# Patient Record
Sex: Female | Born: 1990 | Hispanic: No | Marital: Married | Smoking: Never smoker
Health system: Southern US, Community
[De-identification: ages and names within clinical notes are randomized; demographics above are authoritative.]

## PROBLEM LIST (undated history)

## (undated) DIAGNOSIS — E669 Obesity, unspecified: Secondary | ICD-10-CM

## (undated) DIAGNOSIS — I10 Essential (primary) hypertension: Secondary | ICD-10-CM

## (undated) DIAGNOSIS — O139 Gestational [pregnancy-induced] hypertension without significant proteinuria, unspecified trimester: Secondary | ICD-10-CM

## (undated) HISTORY — DX: Obesity, unspecified: E66.9

---

## 2011-05-02 ENCOUNTER — Emergency Department (HOSPITAL_BASED_OUTPATIENT_CLINIC_OR_DEPARTMENT_OTHER)
Admission: EM | Admit: 2011-05-02 | Discharge: 2011-05-02 | Disposition: A | Discharge status: Home / Self Care | Payer: BC Managed Care – PPO | Attending: Emergency Medicine | Admitting: Emergency Medicine

## 2011-05-02 DIAGNOSIS — M549 Dorsalgia, unspecified: Secondary | ICD-10-CM | POA: Insufficient documentation

## 2011-05-02 DIAGNOSIS — R300 Dysuria: Secondary | ICD-10-CM | POA: Insufficient documentation

## 2011-05-02 DIAGNOSIS — N390 Urinary tract infection, site not specified: Secondary | ICD-10-CM | POA: Insufficient documentation

## 2011-05-02 LAB — URINE MICROSCOPIC-ADD ON

## 2011-05-02 LAB — URINALYSIS, ROUTINE W REFLEX MICROSCOPIC
Glucose, UA: NEGATIVE mg/dL
Nitrite: POSITIVE — AB
Specific Gravity, Urine: 1.015 (ref 1.005–1.030)
pH: 6 (ref 5.0–8.0)

## 2011-05-02 MED ORDER — NITROFURANTOIN MONOHYD MACRO 100 MG PO CAPS: 100.0000 mg | ORAL_CAPSULE | Freq: Two times a day (BID) | ORAL | Status: AC

## 2011-05-02 NOTE — ED Notes (Signed)
Pt states that her back started hurting for two days.  States that the pain started on the right side and is now on both sides.

## 2011-05-02 NOTE — ED Provider Notes (Signed)
History     CSN: [id number] Arrival date & time: 05/02/2011 11:02 AM   First MD Initiated Contact with Patient 05/02/11 1157      Chief Complaint  Patient presents with  . Back Pain    (Consider location/radiation/quality/duration/timing/severity/associated sxs/prior treatment) Patient is a 20 y.o. female presenting with back pain and dysuria. The history is provided by the patient.  Back Pain  This is a new problem. The current episode started more than 2 days ago. The problem occurs constantly. The problem has been gradually worsening. Pain location: flank tenderness. The quality of the pain is described as aching. The pain does not radiate. The pain is at a severity of 4/10. The pain is moderate. Associated symptoms include dysuria. Pertinent negatives include no fever, no pelvic pain and no weakness.  Dysuria  This is a new problem. The current episode started more than 1 week ago. The problem occurs intermittently. The problem has been resolved. The quality of the pain is described as burning. The pain is at a severity of 0/10. The patient is experiencing no pain. There has been no fever. She is not sexually active. Associated symptoms include chills, frequency, urgency and flank pain. Pertinent negatives include no vomiting and no discharge. She has tried nothing for the symptoms.    No past medical history on file.  No past surgical history on file.  History reviewed. No pertinent family history.  History  Substance Use Topics  . Smoking status: Never Smoker   . Smokeless tobacco: Never Used  . Alcohol Use: No    OB History    Grav Para Term Preterm Abortions TAB SAB Ect Mult Living                  Review of Systems  Constitutional: Positive for chills. Negative for fever.  Gastrointestinal: Negative for vomiting.  Genitourinary: Positive for dysuria, urgency, frequency and flank pain. Negative for pelvic pain.  Musculoskeletal: Positive for back pain.    Neurological: Negative for weakness.  All other systems reviewed and are negative.    Allergies  Review of patient's allergies indicates no known allergies.  Home Medications   Current Outpatient Rx  Name Route Sig Dispense Refill  . IBUPROFEN 200 MG PO TABS Oral Take 600 mg by mouth every 6 (six) hours as needed. For back pain     . NITROFURANTOIN MONOHYD MACRO 100 MG PO CAPS Oral Take 1 capsule (100 mg total) by mouth 2 (two) times daily. 14 capsule 0    BP 120/78  Pulse 79  Temp(Src) 98.4 F (36.9 C) (Oral)  Resp 18  Ht 5' (1.524 m)  Wt 188 lb 15 oz (85.7 kg)  BMI 36.90 kg/m2  SpO2 99%  LMP 04/14/2011  Physical Exam  Nursing note and vitals reviewed. Constitutional: She is oriented to person, place, and time. She appears well-developed and well-nourished. No distress.  HENT:  Head: Normocephalic and atraumatic.  Eyes: EOM are normal. Pupils are equal, round, and reactive to light.  Cardiovascular: Normal rate, regular rhythm, normal heart sounds and intact distal pulses.  Exam reveals no friction rub.   No murmur heard. Pulmonary/Chest: Effort normal and breath sounds normal. She has no wheezes. She has no rales.  Abdominal: Soft. Bowel sounds are normal. She exhibits no distension. There is tenderness in the suprapubic area. There is CVA tenderness. There is no rebound and no guarding.  Musculoskeletal: Normal range of motion. She exhibits no tenderness.  No edema  Neurological: She is alert and oriented to person, place, and time. No cranial nerve deficit.  Skin: Skin is warm and dry. No rash noted.  Psychiatric: She has a normal mood and affect. Her behavior is normal.    ED Course  Procedures (including critical care time)  Labs Reviewed  URINALYSIS, ROUTINE W REFLEX MICROSCOPIC - Abnormal; Notable for the following:    Appearance CLOUDY (*)    Hgb urine dipstick SMALL (*)    Nitrite POSITIVE (*)    Leukocytes, UA LARGE (*)    All other components  within normal limits  URINE MICROSCOPIC-ADD ON - Abnormal; Notable for the following:    Bacteria, UA MANY (*)    All other components within normal limits   No results found.   1. UTI (lower urinary tract infection)       MDM   Pt with classic UTI type sx.  No other complaints.  No vaginal complaints.  Will treat with abx.  NOrmal periods and concerns for pregnancy.        Gwyneth Sprout, MD 05/02/11 1438

## 2013-04-11 ENCOUNTER — Emergency Department (HOSPITAL_BASED_OUTPATIENT_CLINIC_OR_DEPARTMENT_OTHER)
Admission: EM | Admit: 2013-04-11 | Discharge: 2013-04-11 | Disposition: A | Discharge status: Home / Self Care | Payer: BC Managed Care – PPO

## 2017-01-14 ENCOUNTER — Ambulatory Visit (HOSPITAL_BASED_OUTPATIENT_CLINIC_OR_DEPARTMENT_OTHER): Payer: 59

## 2017-01-14 ENCOUNTER — Encounter (HOSPITAL_BASED_OUTPATIENT_CLINIC_OR_DEPARTMENT_OTHER): Payer: Self-pay | Admitting: Emergency Medicine

## 2017-01-14 ENCOUNTER — Emergency Department (HOSPITAL_BASED_OUTPATIENT_CLINIC_OR_DEPARTMENT_OTHER)
Admission: EM | Admit: 2017-01-14 | Discharge: 2017-01-14 | Disposition: A | Discharge status: Home / Self Care | Payer: 59 | Attending: Emergency Medicine | Admitting: Emergency Medicine

## 2017-01-14 ENCOUNTER — Emergency Department (HOSPITAL_BASED_OUTPATIENT_CLINIC_OR_DEPARTMENT_OTHER): Payer: 59

## 2017-01-14 DIAGNOSIS — Z3A01 Less than 8 weeks gestation of pregnancy: Secondary | ICD-10-CM | POA: Diagnosis not present

## 2017-01-14 DIAGNOSIS — R1031 Right lower quadrant pain: Secondary | ICD-10-CM

## 2017-01-14 DIAGNOSIS — O26899 Other specified pregnancy related conditions, unspecified trimester: Secondary | ICD-10-CM

## 2017-01-14 DIAGNOSIS — R1030 Lower abdominal pain, unspecified: Secondary | ICD-10-CM | POA: Diagnosis present

## 2017-01-14 DIAGNOSIS — R102 Pelvic and perineal pain: Secondary | ICD-10-CM | POA: Diagnosis not present

## 2017-01-14 DIAGNOSIS — O9989 Other specified diseases and conditions complicating pregnancy, childbirth and the puerperium: Secondary | ICD-10-CM | POA: Diagnosis not present

## 2017-01-14 LAB — CBC WITH DIFFERENTIAL/PLATELET
BASOS PCT: 0 %
Basophils Absolute: 0 10*3/uL (ref 0.0–0.1)
EOS ABS: 0.2 10*3/uL (ref 0.0–0.7)
Eosinophils Relative: 2 %
HEMATOCRIT: 39.4 % (ref 36.0–46.0)
Hemoglobin: 13.5 g/dL (ref 12.0–15.0)
Lymphocytes Relative: 26 %
Lymphs Abs: 3 10*3/uL (ref 0.7–4.0)
MCH: 29.5 pg (ref 26.0–34.0)
MCHC: 34.3 g/dL (ref 30.0–36.0)
MCV: 86 fL (ref 78.0–100.0)
MONOS PCT: 9 %
Monocytes Absolute: 1 10*3/uL (ref 0.1–1.0)
NEUTROS ABS: 7.1 10*3/uL (ref 1.7–7.7)
Neutrophils Relative %: 63 %
Platelets: 305 10*3/uL (ref 150–400)
RBC: 4.58 MIL/uL (ref 3.87–5.11)
RDW: 13.1 % (ref 11.5–15.5)
WBC: 11.4 10*3/uL — ABNORMAL HIGH (ref 4.0–10.5)

## 2017-01-14 LAB — COMPREHENSIVE METABOLIC PANEL
ALBUMIN: 3.8 g/dL (ref 3.5–5.0)
ALT: 25 U/L (ref 14–54)
ANION GAP: 11 (ref 5–15)
AST: 20 U/L (ref 15–41)
Alkaline Phosphatase: 74 U/L (ref 38–126)
BILIRUBIN TOTAL: 0.2 mg/dL — AB (ref 0.3–1.2)
BUN: 8 mg/dL (ref 6–20)
CO2: 21 mmol/L — ABNORMAL LOW (ref 22–32)
Calcium: 9 mg/dL (ref 8.9–10.3)
Chloride: 106 mmol/L (ref 101–111)
Creatinine, Ser: 0.52 mg/dL (ref 0.44–1.00)
GFR calc non Af Amer: >60 mL/min (ref >60)
GLUCOSE: 118 mg/dL — AB (ref 65–99)
POTASSIUM: 3.8 mmol/L (ref 3.5–5.1)
Sodium: 138 mmol/L (ref 135–145)
TOTAL PROTEIN: 6.9 g/dL (ref 6.5–8.1)

## 2017-01-14 LAB — RH IG WORKUP (INCLUDES ABO/RH)
ABO/RH(D): O NEG
ANTIBODY SCREEN: NEGATIVE

## 2017-01-14 LAB — HCG, QUANTITATIVE, PREGNANCY: HCG, BETA CHAIN, QUANT, S: 50 m[IU]/mL — AB (ref <5)

## 2017-01-14 LAB — ABO/RH: ABO/RH(D): O NEG

## 2017-01-14 NOTE — ED Notes (Signed)
Rounded on pt who is currently in US.

## 2017-01-14 NOTE — ED Notes (Signed)
Patient transported to Ultrasound 

## 2017-01-14 NOTE — ED Notes (Signed)
ED Provider at bedside. 

## 2017-01-14 NOTE — ED Provider Notes (Signed)
[facility] Provider Note   CSN: [id number] Arrival date & time: 01/14/17  1013     History   Chief Complaint Chief Complaint  Patient presents with  . Abdominal Pain    HPI Katelyn Bonilla is a 26 y.o. female.  Patient presents for evaluation of right sided abdominal pain that started yesterday in the lower abdomen and extends to the right flank area. No nausea, vomiting, fever. She denies dysuria, vaginal bleeding or discharge. She was seen at Urgent Care and told she was pregnant and that she would have to come here for further evaluation. She reports currently undergoing fertility treatments.    The history is provided by the patient and the spouse. No language interpreter was used.    History reviewed. No pertinent past medical history.  There are no active problems to display for this patient.   History reviewed. No pertinent surgical history.  OB History    No data available       Home Medications    Prior to Admission medications   Medication Sig Start Date End Date Taking? Authorizing Provider  ibuprofen (ADVIL,MOTRIN) 200 MG tablet Take 600 mg by mouth every 6 (six) hours as needed. For back pain     [provider]    Family History History reviewed. No pertinent family history.  Social History Social History  Substance Use Topics  . Smoking status: Never Smoker  . Smokeless tobacco: Never Used  . Alcohol use No     Allergies   Patient has no known allergies.   Review of Systems Review of Systems  Constitutional: Negative for chills and fever.  HENT: Negative.   Respiratory: Negative.   Cardiovascular: Negative.   Gastrointestinal: Positive for abdominal pain. Negative for nausea and vomiting.  Genitourinary: Positive for flank pain. Negative for dysuria, frequency, pelvic pain, vaginal bleeding and vaginal discharge.  Skin: Negative.   Neurological: Negative.      Physical Exam Updated Vital Signs BP (!)  140/94 (BP Location: Left Arm)   Pulse 70   Temp 98 F (36.7 C) (Oral)   Resp 18   Ht 5\' 2"  (1.575 m)   Wt 96.2 kg (212 lb)   LMP 01/01/2017   SpO2 100%   BMI 38.78 kg/m   Physical Exam  Constitutional: She is oriented to person, place, and time. She appears well-developed and well-nourished.  HENT:  Head: Normocephalic.  Neck: Normal range of motion. Neck supple.  Cardiovascular: Normal rate and regular rhythm.   Pulmonary/Chest: Effort normal and breath sounds normal. She has no wheezes. She has no rales.  Abdominal: Soft. Bowel sounds are normal. There is no tenderness. There is no rebound and no guarding.  Musculoskeletal: Normal range of motion.  Neurological: She is alert and oriented to person, place, and time.  Skin: Skin is warm and dry. No rash noted.  Psychiatric: She has a normal mood and affect.     ED Treatments / Results  Labs (all labs ordered are listed, but only abnormal results are displayed) Labs Reviewed  CBC WITH DIFFERENTIAL/PLATELET - Abnormal; Notable for the following:       Result Value   WBC 11.4 (*)    All other components within normal limits  COMPREHENSIVE METABOLIC PANEL - Abnormal; Notable for the following:    CO2 21 (*)    Glucose, Bld 118 (*)    Total Bilirubin 0.2 (*)    All other components within normal limits  HCG, QUANTITATIVE, PREGNANCY -  Abnormal; Notable for the following:    hCG, Beta Chain, Quant, S 50 (*)    All other components within normal limits  ABO/RH  RH IG WORKUP (INCLUDES ABO/RH)   Results for orders placed or performed during the hospital encounter of 01/14/17  CBC with Differential  Result Value Ref Range   WBC 11.4 (H) 4.0 - 10.5 K/uL   RBC 4.58 3.87 - 5.11 MIL/uL   Hemoglobin 13.5 12.0 - 15.0 g/dL   HCT 54.039.4 98.136.0 - 19.146.0 %   MCV 86.0 78.0 - 100.0 fL   MCH 29.5 26.0 - 34.0 pg   MCHC 34.3 30.0 - 36.0 g/dL   RDW 47.813.1 29.511.5 - 62.115.5 %   Platelets 305 150 - 400 K/uL   Neutrophils Relative % 63 %   Neutro Abs  7.1 1.7 - 7.7 K/uL   Lymphocytes Relative 26 %   Lymphs Abs 3.0 0.7 - 4.0 K/uL   Monocytes Relative 9 %   Monocytes Absolute 1.0 0.1 - 1.0 K/uL   Eosinophils Relative 2 %   Eosinophils Absolute 0.2 0.0 - 0.7 K/uL   Basophils Relative 0 %   Basophils Absolute 0.0 0.0 - 0.1 K/uL  Comprehensive metabolic panel  Result Value Ref Range   Sodium 138 135 - 145 mmol/L   Potassium 3.8 3.5 - 5.1 mmol/L   Chloride 106 101 - 111 mmol/L   CO2 21 (L) 22 - 32 mmol/L   Glucose, Bld 118 (H) 65 - 99 mg/dL   BUN 8 6 - 20 mg/dL   Creatinine, Ser 3.080.52 0.44 - 1.00 mg/dL   Calcium 9.0 8.9 - 65.710.3 mg/dL   Total Protein 6.9 6.5 - 8.1 g/dL   Albumin 3.8 3.5 - 5.0 g/dL   AST 20 15 - 41 U/L   ALT 25 14 - 54 U/L   Alkaline Phosphatase 74 38 - 126 U/L   Total Bilirubin 0.2 (L) 0.3 - 1.2 mg/dL   GFR calc non Af Amer >60 >60 mL/min   GFR calc Af Amer >60 >60 mL/min   Anion gap 11 5 - 15  hCG, quantitative, pregnancy  Result Value Ref Range   hCG, Beta Chain, Quant, S 50 (H) <5 mIU/mL  ABO/Rh  Result Value Ref Range   ABO/RH(D) O NEG   Rh IG workup (includes ABO/Rh)  Result Value Ref Range   ABO/RH(D) O NEG    Antibody Screen      NEG Performed at [facility], [address] [place], [place] [address]     EKG  EKG Interpretation None       Radiology Kore[place] Ob Comp Less 14 Wks  Result Date: 01/14/2017 CLINICAL DATA:  Right lower quadrant pain x1 day, pregnant, beta HCG 50 EXAM: OBSTETRIC <14 WK US AND TRANSVAGINAL OB US DOPPLER ULTRASOUND OF OVARIES TECHNIQUE: Both transabdominal and transvaginal ultrasound examinations were performed for complete evaluation of the gestation as well as the maternal uterus, adnexal regions, and pelvic cul-de-sac. Transvaginal technique was performed to assess early pregnancy. Color and duplex Doppler ultrasound was utilized to evaluate blood flow to the ovaries. COMPARISON:  None. FINDINGS: Intrauterine gestational sac: None Yolk sac:  Not Visualized. Embryo:   Not Visualized. Subchorionic hemorrhage:  None visualized. Maternal uterus/adnexae: Bilateral ovaries are within normal limits, noting a 2.7 x 1.9 x 2.3 cm right corpus luteal cyst. No free fluid. Pulsed Doppler evaluation of both ovaries demonstrates normal appearing low-resistance arterial and venous waveforms. Technically speaking, the arterial waveform on  the left ovary is less well visualized but remains present, and a normal venous waveform is identified (in the setting of ischemia, the venous waveform would be lost first). IMPRESSION: No IUP is visualized. This is not unexpected given the low beta HCG. By definition, in the setting of a positive pregnancy test, this reflects a pregnancy of unknown location. Differential considerations include early normal IUP, abnormal IUP/missed abortion, or nonvisualized ectopic pregnancy. Serial beta HCG is suggested. Consider repeat pelvic ultrasound in 14 days. No evidence of ovarian torsion. Electronically Signed   By: Charline Bills M.D.   On: 01/14/2017 13:39   US Ob Transvaginal  Result Date: 01/14/2017 CLINICAL DATA:  Right lower quadrant pain x1 day, pregnant, beta HCG 50 EXAM: OBSTETRIC <14 WK [place] AND TRANSVAGINAL OB US DOPPLER ULTRASOUND OF OVARIES TECHNIQUE: Both transabdominal and transvaginal ultrasound examinations were performed for complete evaluation of the gestation as well as the maternal uterus, adnexal regions, and pelvic cul-de-sac. Transvaginal technique was performed to assess early pregnancy. Color and duplex Doppler ultrasound was utilized to evaluate blood flow to the ovaries. COMPARISON:  None. FINDINGS: Intrauterine gestational sac: None Yolk sac:  Not Visualized. Embryo:  Not Visualized. Subchorionic hemorrhage:  None visualized. Maternal uterus/adnexae: Bilateral ovaries are within normal limits, noting a 2.7 x 1.9 x 2.3 cm right corpus luteal cyst. No free fluid. Pulsed Doppler evaluation of both ovaries demonstrates normal appearing  low-resistance arterial and venous waveforms. Technically speaking, the arterial waveform on the left ovary is less well visualized but remains present, and a normal venous waveform is identified (in the setting of ischemia, the venous waveform would be lost first). IMPRESSION: No IUP is visualized. This is not unexpected given the low beta HCG. By definition, in the setting of a positive pregnancy test, this reflects a pregnancy of unknown location. Differential considerations include early normal IUP, abnormal IUP/missed abortion, or nonvisualized ectopic pregnancy. Serial beta HCG is suggested. Consider repeat pelvic ultrasound in 14 days. No evidence of ovarian torsion. Electronically Signed   By: Charline Bills M.D.   On: 01/14/2017 13:39   [place] Art/ven Flow Abd Pelv Doppler  Result Date: 01/14/2017 CLINICAL DATA:  Right lower quadrant pain x1 day, pregnant, beta HCG 50 EXAM: OBSTETRIC <14 WK [place] AND TRANSVAGINAL OB US DOPPLER ULTRASOUND OF OVARIES TECHNIQUE: Both transabdominal and transvaginal ultrasound examinations were performed for complete evaluation of the gestation as well as the maternal uterus, adnexal regions, and pelvic cul-de-sac. Transvaginal technique was performed to assess early pregnancy. Color and duplex Doppler ultrasound was utilized to evaluate blood flow to the ovaries. COMPARISON:  None. FINDINGS: Intrauterine gestational sac: None Yolk sac:  Not Visualized. Embryo:  Not Visualized. Subchorionic hemorrhage:  None visualized. Maternal uterus/adnexae: Bilateral ovaries are within normal limits, noting a 2.7 x 1.9 x 2.3 cm right corpus luteal cyst. No free fluid. Pulsed Doppler evaluation of both ovaries demonstrates normal appearing low-resistance arterial and venous waveforms. Technically speaking, the arterial waveform on the left ovary is less well visualized but remains present, and a normal venous waveform is identified (in the setting of ischemia, the venous waveform would be  lost first). IMPRESSION: No IUP is visualized. This is not unexpected given the low beta HCG. By definition, in the setting of a positive pregnancy test, this reflects a pregnancy of unknown location. Differential considerations include early normal IUP, abnormal IUP/missed abortion, or nonvisualized ectopic pregnancy. Serial beta HCG is suggested. Consider repeat pelvic ultrasound in 14 days. No evidence of ovarian torsion. Electronically  Signed   By: Charline Bills M.D.   On: 01/14/2017 13:39    Procedures Procedures (including critical care time)  Medications Ordered in ED Medications - No data to display   Initial Impression / Assessment and Plan / ED Course  I have reviewed the triage vital signs and the nursing notes.  Pertinent labs & imaging results that were available during my care of the patient were reviewed by me and considered in my medical decision making (see chart for details).     Patient known to be in early pregnancy presents with right sided abdominal pain. No vaginal symptoms. Seen at [facility] prior to arrival and told she had a positive pregnancy test. Sent here for further evaluation.  Vaginal exam is benign. No bleeding, adnexal or cervical tenderness. Labs are reassuring with low quantitative of 50, no visualized IUP as expected.   Discussed differential that includes appendicitis and the need for close follow up. They plan to see their OB at [facility] on Monday. Return precautions discussed.   Final Clinical Impressions(s) / ED Diagnoses   Final diagnoses:  Pelvic pain during pregnancy    New Prescriptions New Prescriptions   No medications on file     Elpidio Anis, Cordelia Poche 01/14/17 1450    Shaune Pollack, MD 01/15/17 [phone]

## 2017-01-14 NOTE — ED Triage Notes (Signed)
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 The patient reports that she is having pain to her right lower pelvic region and when the pain is the worst it radiates to her right flank region. Patient went to an Urgent care and her pregnancy test was positive.

## 2017-11-21 ENCOUNTER — Other Ambulatory Visit (HOSPITAL_COMMUNITY): Payer: Self-pay | Admitting: Obstetrics and Gynecology

## 2017-11-21 ENCOUNTER — Ambulatory Visit (HOSPITAL_COMMUNITY)
Admission: RE | Admit: 2017-11-21 | Discharge: 2017-11-21 | Disposition: A | Discharge status: Home / Self Care | Payer: 59 | Source: Ambulatory Visit | Attending: Obstetrics and Gynecology | Admitting: Obstetrics and Gynecology

## 2017-11-21 DIAGNOSIS — N83202 Unspecified ovarian cyst, left side: Secondary | ICD-10-CM | POA: Insufficient documentation

## 2017-11-21 DIAGNOSIS — R109 Unspecified abdominal pain: Secondary | ICD-10-CM

## 2019-04-12 ENCOUNTER — Other Ambulatory Visit: Payer: Self-pay

## 2019-04-12 DIAGNOSIS — Z20822 Contact with and (suspected) exposure to covid-19: Secondary | ICD-10-CM

## 2019-04-14 LAB — NOVEL CORONAVIRUS, NAA: SARS-CoV-2, NAA: NOT DETECTED

## 2019-06-28 NOTE — L&D Delivery Note (Signed)
Patient was C/C/+2 and pushed for 45 minutes with epidural.    BPs during pushing went to 190s/110s and was given labetalol up to 80 mg IV.  BP went back down to 130-150s/100s.  Pt pushed to +4, almost crowning but FHTs went to 60s and pt was unable in 3 contractions to deliver head.  VE d/w pt including risks and she accepted. VE applied and pulled one push with no popoffs to delivery of head.   SVD  female infant, Apgars 8,9, weight P.   The patient had a midline perineal first degree, subclitoral and L labial lacerations repaired with 2-0 vicryl and 3-0 vicryl R.   Placenta was delivered intact but bleeding was in excess of expected.   A small piece of placenta was removed with uterine exploration but bleeding continued.  PP Hemorrhage called and pt got: Pitocin, hemabate, cytotec rectally, TXA. Another uterine exploration with nothing else removed. All labs. T&C 2 units, on hold for now. Second line. Bakri placed and 350 cc NS in balloon. Foley had remained in place.  With the Bakri the bleeding finally slowed. Final EBL was 1238. Lochia now slightly more than normal but not excessive.  Pt now c/o some dizziness and feeling drowsy- she did get 2 gm of stadol for pain but will transfuse per sx and per h/h.  Vagina was clear.  Delayed cord clamping done for 30-60 seconds while warming baby. Baby was vigorous and doing skin to skin with mother.  Loney Laurence

## 2019-08-04 DIAGNOSIS — E559 Vitamin D deficiency, unspecified: Secondary | ICD-10-CM | POA: Insufficient documentation

## 2019-08-04 DIAGNOSIS — E782 Mixed hyperlipidemia: Secondary | ICD-10-CM | POA: Insufficient documentation

## 2019-09-02 LAB — OB RESULTS CONSOLE HEPATITIS B SURFACE ANTIGEN: Hepatitis B Surface Ag: NEGATIVE

## 2019-09-02 LAB — OB RESULTS CONSOLE HIV ANTIBODY (ROUTINE TESTING): HIV: NONREACTIVE

## 2019-09-02 LAB — OB RESULTS CONSOLE RPR: RPR: NONREACTIVE

## 2019-09-02 LAB — OB RESULTS CONSOLE RUBELLA ANTIBODY, IGM: Rubella: IMMUNE

## 2020-04-15 ENCOUNTER — Other Ambulatory Visit: Payer: Self-pay

## 2020-04-15 ENCOUNTER — Encounter: Payer: 59 | Attending: Obstetrics and Gynecology | Admitting: Registered"

## 2020-04-15 ENCOUNTER — Encounter: Payer: Self-pay | Admitting: Registered"

## 2020-04-15 DIAGNOSIS — O24419 Gestational diabetes mellitus in pregnancy, unspecified control: Secondary | ICD-10-CM | POA: Diagnosis not present

## 2020-04-15 HISTORY — DX: Gestational diabetes mellitus in pregnancy, unspecified control: O24.419

## 2020-04-15 NOTE — Progress Notes (Signed)
Patient was seen on 04/15/20 for Gestational Diabetes self-management class at the [facility]. The following learning objectives were met by the patient during this course:   States the definition of Gestational Diabetes  States why dietary management is important in controlling blood glucose  Describes the effects each nutrient has on blood glucose levels  Demonstrates ability to create a balanced meal plan  Demonstrates carbohydrate counting   States when to check blood glucose levels  Demonstrates proper blood glucose monitoring techniques  States the effect of stress and exercise on blood glucose levels  States the importance of limiting caffeine and abstaining from alcohol and smoking  Blood glucose monitor given: none Patient has meter and is checking blood sugar prior to class.   Patient instructed to monitor glucose levels: FBS: 60 - <95; 1 hour: <140; 2 hour: <120  Patient received handouts:  Nutrition Diabetes and Pregnancy, including carb counting list  Patient will be seen for follow-up as needed.

## 2020-04-27 ENCOUNTER — Other Ambulatory Visit: Payer: Self-pay | Admitting: Obstetrics

## 2020-04-27 DIAGNOSIS — Z3A28 28 weeks gestation of pregnancy: Secondary | ICD-10-CM

## 2020-04-27 DIAGNOSIS — O24419 Gestational diabetes mellitus in pregnancy, unspecified control: Secondary | ICD-10-CM

## 2020-05-04 ENCOUNTER — Encounter: Payer: Self-pay | Admitting: *Deleted

## 2020-05-06 ENCOUNTER — Other Ambulatory Visit: Payer: Self-pay

## 2020-05-06 ENCOUNTER — Ambulatory Visit: Payer: Managed Care, Other (non HMO) | Attending: Obstetrics

## 2020-05-06 ENCOUNTER — Other Ambulatory Visit: Payer: Self-pay | Admitting: *Deleted

## 2020-05-06 ENCOUNTER — Ambulatory Visit: Payer: Managed Care, Other (non HMO) | Admitting: *Deleted

## 2020-05-06 ENCOUNTER — Encounter: Payer: Self-pay | Admitting: *Deleted

## 2020-05-06 VITALS — BP 134/68 | HR 99

## 2020-05-06 DIAGNOSIS — O24415 Gestational diabetes mellitus in pregnancy, controlled by oral hypoglycemic drugs: Secondary | ICD-10-CM

## 2020-05-06 DIAGNOSIS — O24419 Gestational diabetes mellitus in pregnancy, unspecified control: Secondary | ICD-10-CM | POA: Diagnosis not present

## 2020-05-06 DIAGNOSIS — Z3A28 28 weeks gestation of pregnancy: Secondary | ICD-10-CM | POA: Insufficient documentation

## 2020-05-19 ENCOUNTER — Ambulatory Visit: Payer: Managed Care, Other (non HMO) | Attending: Obstetrics and Gynecology

## 2020-05-19 ENCOUNTER — Ambulatory Visit: Payer: Managed Care, Other (non HMO) | Admitting: *Deleted

## 2020-05-19 ENCOUNTER — Other Ambulatory Visit: Payer: Self-pay

## 2020-05-19 ENCOUNTER — Encounter: Payer: Self-pay | Admitting: *Deleted

## 2020-05-19 VITALS — BP 131/78 | HR 87

## 2020-05-19 DIAGNOSIS — Z3A31 31 weeks gestation of pregnancy: Secondary | ICD-10-CM

## 2020-05-19 DIAGNOSIS — O99213 Obesity complicating pregnancy, third trimester: Secondary | ICD-10-CM | POA: Diagnosis not present

## 2020-05-19 DIAGNOSIS — O24415 Gestational diabetes mellitus in pregnancy, controlled by oral hypoglycemic drugs: Secondary | ICD-10-CM | POA: Diagnosis not present

## 2020-05-19 DIAGNOSIS — O24419 Gestational diabetes mellitus in pregnancy, unspecified control: Secondary | ICD-10-CM | POA: Insufficient documentation

## 2020-05-19 DIAGNOSIS — Z6841 Body Mass Index (BMI) 40.0 and over, adult: Secondary | ICD-10-CM

## 2020-05-19 DIAGNOSIS — O360130 Maternal care for anti-D [Rh] antibodies, third trimester, not applicable or unspecified: Secondary | ICD-10-CM

## 2020-05-26 ENCOUNTER — Ambulatory Visit: Payer: Managed Care, Other (non HMO) | Attending: Obstetrics and Gynecology

## 2020-05-26 ENCOUNTER — Other Ambulatory Visit: Payer: Self-pay | Admitting: *Deleted

## 2020-05-26 ENCOUNTER — Ambulatory Visit: Payer: Managed Care, Other (non HMO) | Admitting: *Deleted

## 2020-05-26 ENCOUNTER — Telehealth: Payer: Self-pay

## 2020-05-26 ENCOUNTER — Other Ambulatory Visit: Payer: Self-pay

## 2020-05-26 ENCOUNTER — Encounter: Payer: Self-pay | Admitting: *Deleted

## 2020-05-26 VITALS — BP 134/85 | HR 92

## 2020-05-26 DIAGNOSIS — O10913 Unspecified pre-existing hypertension complicating pregnancy, third trimester: Secondary | ICD-10-CM

## 2020-05-26 DIAGNOSIS — O24419 Gestational diabetes mellitus in pregnancy, unspecified control: Secondary | ICD-10-CM | POA: Diagnosis present

## 2020-05-26 DIAGNOSIS — O10919 Unspecified pre-existing hypertension complicating pregnancy, unspecified trimester: Secondary | ICD-10-CM

## 2020-05-26 DIAGNOSIS — Z3A32 32 weeks gestation of pregnancy: Secondary | ICD-10-CM

## 2020-05-26 DIAGNOSIS — O360130 Maternal care for anti-D [Rh] antibodies, third trimester, not applicable or unspecified: Secondary | ICD-10-CM

## 2020-05-26 DIAGNOSIS — O24415 Gestational diabetes mellitus in pregnancy, controlled by oral hypoglycemic drugs: Secondary | ICD-10-CM

## 2020-05-26 DIAGNOSIS — O99213 Obesity complicating pregnancy, third trimester: Secondary | ICD-10-CM | POA: Diagnosis not present

## 2020-05-26 NOTE — Telephone Encounter (Signed)
Scheduled pt's appts per 11/30 order. Gave pot calendar with appt.

## 2020-05-29 ENCOUNTER — Other Ambulatory Visit: Payer: Self-pay

## 2020-05-29 ENCOUNTER — Observation Stay (HOSPITAL_BASED_OUTPATIENT_CLINIC_OR_DEPARTMENT_OTHER)
Admission: EM | Admit: 2020-05-29 | Discharge: 2020-05-31 | Disposition: A | Discharge status: Home / Self Care | Payer: Managed Care, Other (non HMO) | Attending: Emergency Medicine | Admitting: Emergency Medicine

## 2020-05-29 ENCOUNTER — Encounter (HOSPITAL_BASED_OUTPATIENT_CLINIC_OR_DEPARTMENT_OTHER): Payer: Self-pay | Admitting: Emergency Medicine

## 2020-05-29 DIAGNOSIS — R030 Elevated blood-pressure reading, without diagnosis of hypertension: Secondary | ICD-10-CM | POA: Diagnosis not present

## 2020-05-29 DIAGNOSIS — Z79899 Other long term (current) drug therapy: Secondary | ICD-10-CM | POA: Diagnosis not present

## 2020-05-29 DIAGNOSIS — I10 Essential (primary) hypertension: Secondary | ICD-10-CM

## 2020-05-29 DIAGNOSIS — O119 Pre-existing hypertension with pre-eclampsia, unspecified trimester: Secondary | ICD-10-CM | POA: Diagnosis present

## 2020-05-29 DIAGNOSIS — Z20822 Contact with and (suspected) exposure to covid-19: Secondary | ICD-10-CM | POA: Diagnosis not present

## 2020-05-29 DIAGNOSIS — O24415 Gestational diabetes mellitus in pregnancy, controlled by oral hypoglycemic drugs: Secondary | ICD-10-CM | POA: Insufficient documentation

## 2020-05-29 DIAGNOSIS — Z3A34 34 weeks gestation of pregnancy: Secondary | ICD-10-CM | POA: Diagnosis not present

## 2020-05-29 DIAGNOSIS — Z7984 Long term (current) use of oral hypoglycemic drugs: Secondary | ICD-10-CM | POA: Diagnosis not present

## 2020-05-29 DIAGNOSIS — Z7982 Long term (current) use of aspirin: Secondary | ICD-10-CM | POA: Diagnosis not present

## 2020-05-29 DIAGNOSIS — O113 Pre-existing hypertension with pre-eclampsia, third trimester: Secondary | ICD-10-CM | POA: Diagnosis not present

## 2020-05-29 DIAGNOSIS — O26893 Other specified pregnancy related conditions, third trimester: Secondary | ICD-10-CM | POA: Diagnosis present

## 2020-05-29 DIAGNOSIS — O288 Other abnormal findings on antenatal screening of mother: Secondary | ICD-10-CM

## 2020-05-29 DIAGNOSIS — O99891 Other specified diseases and conditions complicating pregnancy: Secondary | ICD-10-CM | POA: Diagnosis not present

## 2020-05-29 DIAGNOSIS — O10013 Pre-existing essential hypertension complicating pregnancy, third trimester: Secondary | ICD-10-CM | POA: Insufficient documentation

## 2020-05-29 DIAGNOSIS — R519 Headache, unspecified: Secondary | ICD-10-CM | POA: Diagnosis not present

## 2020-05-29 DIAGNOSIS — Z6791 Unspecified blood type, Rh negative: Secondary | ICD-10-CM

## 2020-05-29 DIAGNOSIS — Z3A33 33 weeks gestation of pregnancy: Secondary | ICD-10-CM | POA: Diagnosis not present

## 2020-05-29 HISTORY — DX: Gestational (pregnancy-induced) hypertension without significant proteinuria, unspecified trimester: O13.9

## 2020-05-29 HISTORY — DX: Essential (primary) hypertension: I10

## 2020-05-29 LAB — URINALYSIS, ROUTINE W REFLEX MICROSCOPIC
Bilirubin Urine: NEGATIVE
Glucose, UA: NEGATIVE mg/dL
Hgb urine dipstick: NEGATIVE
Ketones, ur: NEGATIVE mg/dL
Leukocytes,Ua: NEGATIVE
Nitrite: NEGATIVE
Protein, ur: 100 mg/dL — AB
Specific Gravity, Urine: 1.01 (ref 1.005–1.030)
pH: 6.5 (ref 5.0–8.0)

## 2020-05-29 LAB — COMPREHENSIVE METABOLIC PANEL
ALT: 24 U/L (ref 0–44)
AST: 24 U/L (ref 15–41)
Albumin: 2.8 g/dL — ABNORMAL LOW (ref 3.5–5.0)
Alkaline Phosphatase: 142 U/L — ABNORMAL HIGH (ref 38–126)
Anion gap: 12 (ref 5–15)
BUN: 12 mg/dL (ref 6–20)
CO2: 18 mmol/L — ABNORMAL LOW (ref 22–32)
Calcium: 9.1 mg/dL (ref 8.9–10.3)
Chloride: 102 mmol/L (ref 98–111)
Creatinine, Ser: 0.52 mg/dL (ref 0.44–1.00)
GFR, Estimated: >60 mL/min (ref >60)
Glucose, Bld: 98 mg/dL (ref 70–99)
Potassium: 4 mmol/L (ref 3.5–5.1)
Sodium: 132 mmol/L — ABNORMAL LOW (ref 135–145)
Total Bilirubin: 0.3 mg/dL (ref 0.3–1.2)
Total Protein: 6.4 g/dL — ABNORMAL LOW (ref 6.5–8.1)

## 2020-05-29 LAB — CBC WITH DIFFERENTIAL/PLATELET
Abs Immature Granulocytes: 0.05 10*3/uL (ref 0.00–0.07)
Basophils Absolute: 0.1 10*3/uL (ref 0.0–0.1)
Basophils Relative: 0 %
Eosinophils Absolute: 0.1 10*3/uL (ref 0.0–0.5)
Eosinophils Relative: 1 %
HCT: 39.5 % (ref 36.0–46.0)
Hemoglobin: 13.8 g/dL (ref 12.0–15.0)
Immature Granulocytes: 0 %
Lymphocytes Relative: 17 %
Lymphs Abs: 2.6 10*3/uL (ref 0.7–4.0)
MCH: 29.7 pg (ref 26.0–34.0)
MCHC: 34.9 g/dL (ref 30.0–36.0)
MCV: 85.1 fL (ref 80.0–100.0)
Monocytes Absolute: 1.4 10*3/uL — ABNORMAL HIGH (ref 0.1–1.0)
Monocytes Relative: 9 %
Neutro Abs: 11.2 10*3/uL — ABNORMAL HIGH (ref 1.7–7.7)
Neutrophils Relative %: 73 %
Platelets: 335 10*3/uL (ref 150–400)
RBC: 4.64 MIL/uL (ref 3.87–5.11)
RDW: 13.3 % (ref 11.5–15.5)
WBC: 15.4 10*3/uL — ABNORMAL HIGH (ref 4.0–10.5)
nRBC: 0 % (ref 0.0–0.2)

## 2020-05-29 LAB — URINALYSIS, MICROSCOPIC (REFLEX): RBC / HPF: NONE SEEN RBC/hpf (ref 0–5)

## 2020-05-29 MED ORDER — LABETALOL HCL 5 MG/ML IV SOLN: 5.0000 mg | Freq: Once | INTRAVENOUS | Status: DC

## 2020-05-29 MED ORDER — ACETAMINOPHEN 500 MG PO TABS
1000.0000 mg | ORAL_TABLET | Freq: Once | ORAL | Status: AC
  Administered 2020-05-29: 1000 mg via ORAL
  Filled 2020-05-29: qty 2

## 2020-05-29 NOTE — Progress Notes (Signed)
Called Dr. Tenny Craw to update her on lab results and vital signs. Patient is being transferred to [facility].

## 2020-05-29 NOTE — ED Notes (Signed)
OB rapid response notified , pt placed on toco, OBYGYN on call Ross MD , orders cbc cmp , q 15 mins bp and protein , creatine urine, ED MD made aware

## 2020-05-29 NOTE — Progress Notes (Signed)
Notified Dr. Tenny Craw of patients arrival to [facility] C/O elevated blood pressures and headache. CMP and protein/creatinine ratio urine ordered. Q15 minute blood pressures ordered.

## 2020-05-29 NOTE — ED Triage Notes (Signed)
Patient presents with complaints of headaches onset this evening at approx 1730; states headache worsening; denies NV; denies abd pain; denies back pain; denies vaginal bleeding; denies fluid leaking; denies vaginal dc. States started Procardia XL today and onset of headache afterwards. Patient was seen by OB today.

## 2020-05-29 NOTE — ED Provider Notes (Signed)
[facility] EMERGENCY DEPARTMENT Provider Note   CSN: [id number] Arrival date & time: 05/29/20  2222     History Chief Complaint  Patient presents with  . Headache    Katelyn Bonilla is a 29 y.o. female.  29 year old female G1 P0 at approximately 35 weeks who presents the emergency department today secondary to headache.  Information from the patient and charts it appears that she was diagnosed with third trimester hypertension but her blood pressures have been in the 130s up until last Friday.  This last Friday she stated her blood pressure started to get a little higher in the 150s.  She went into the MAU where they sent her home and then she went back again to the doctor's office today at which time they started on Procardia.  This evening she started to have a headache.  She checked her blood pressures never little bit higher in the higher 150s.  She presents here for further evaluation.  Nursing is already discussed with the obstetrician and at this time they do not want any intervention, recheck blood pressures, check CBC, CMP and urine creatinine protein.  Patient does not have any vision changes.  She has no abdominal pain.  She has no polyuria.  No vaginal discharge, bleeding, leakage of fluid.  She still feels baby move.   Headache      Past Medical History:  Diagnosis Date  . Obesity    BMI >40    Patient Active Problem List   Diagnosis Date Noted  . Gestational diabetes mellitus (GDM), antepartum 04/15/2020    History reviewed. No pertinent surgical history.   OB History    Gravida  1   Para      Term      Preterm      AB      Living        SAB      TAB      Ectopic      Multiple      Live Births              History reviewed. No pertinent family history.  Social History   Tobacco Use  . Smoking status: Never Smoker  . Smokeless tobacco: Never Used  Vaping Use  . Vaping Use: Never used  Substance Use Topics  . Alcohol  use: No  . Drug use: No    Home Medications Prior to Admission medications   Medication Sig Start Date End Date Taking? Authorizing Provider  NIFEdipine (PROCARDIA-XL/NIFEDICAL-XL) 30 MG 24 hr tablet Take 30 mg by mouth daily.   Yes [provider]  aspirin EC 81 MG tablet Take 81 mg by mouth daily. Swallow whole.    [provider]  Cholecalciferol (VITAMIN D3) 1.25 MG (50000 UT) CAPS Take by mouth.    [provider]  ibuprofen (ADVIL,MOTRIN) 200 MG tablet Take 600 mg by mouth every 6 (six) hours as needed. For back pain  Patient not taking: Reported on 05/06/2020    [provider]  metFORMIN (GLUCOPHAGE) 500 MG tablet Take 500 mg by mouth once.    [provider]  omeprazole (PRILOSEC) 20 MG capsule Take 20 mg by mouth daily. 05/10/20   [provider]  Prenatal Vit-Fe Fumarate-FA (PRENATAL MULTIVITAMIN) TABS tablet Take 1 tablet by mouth daily at 12 noon.    [provider]    Allergies    Patient has no known allergies.  Review of Systems   Review  of Systems  Neurological: Positive for headaches.  All other systems reviewed and are negative.   Physical Exam Updated Vital Signs BP (!) 176/109 (BP Location: Left Arm)   Pulse 98   Temp 99.1 F (37.3 C) (Oral)   Resp 20   Ht 5\' 2"  (1.575 m)   Wt 96.2 kg   LMP 10/10/2019   SpO2 100%   BMI 38.79 kg/m   Physical Exam Vitals and nursing note reviewed.  Constitutional:      Appearance: She is well-developed.  HENT:     Head: Normocephalic and atraumatic.     Mouth/Throat:     Mouth: Mucous membranes are moist.  Eyes:     Pupils: Pupils are equal, round, and reactive to light.  Cardiovascular:     Rate and Rhythm: Normal rate and regular rhythm.  Pulmonary:     Effort: No respiratory distress.     Breath sounds: No stridor.  Abdominal:     General: There is no distension.  Musculoskeletal:        General: No swelling or tenderness. Normal range of  motion.     Cervical back: Normal range of motion.  Skin:    General: Skin is warm and dry.  Neurological:     Mental Status: She is alert and oriented to person, place, and time. Mental status is at baseline.     Cranial Nerves: No cranial nerve deficit or dysarthria.     ED Results / Procedures / Treatments   Labs (all labs ordered are listed, but only abnormal results are displayed) Labs Reviewed  CBC WITH DIFFERENTIAL/PLATELET - Abnormal; Notable for the following components:      Result Value   WBC 15.4 (*)    Neutro Abs 11.2 (*)    Monocytes Absolute 1.4 (*)    All other components within normal limits  COMPREHENSIVE METABOLIC PANEL - Abnormal; Notable for the following components:   Sodium 132 (*)    CO2 18 (*)    Total Protein 6.4 (*)    Albumin 2.8 (*)    Alkaline Phosphatase 142 (*)    All other components within normal limits  PROTEIN / CREATININE RATIO, URINE  URINALYSIS, ROUTINE W REFLEX MICROSCOPIC    EKG None  Radiology No results found.  Procedures Procedures (including critical care time)  Medications Ordered in ED Medications  acetaminophen (TYLENOL) tablet 1,000 mg (1,000 mg Oral Given 05/29/20 2340)    ED Course  I have reviewed the triage vital signs and the nursing notes.  Pertinent labs & imaging results that were available during my care of the patient were reviewed by me and considered in my medical decision making (see chart for details).    MDM Rules/Calculators/A&P                          Current concern for possible preeclampsia with rising blood pressures and associated headache in the emergency room.  I discussed with Dr. 14/3/21 at [facility].  I specifically asked if she wanted me to give labetalol and/or magnesium and she said no and preferred transfer to the MAU.  Will work on same.  Final Clinical Impression(s) / ED Diagnoses Final diagnoses:  Acute nonintractable headache, unspecified headache type  Hypertension,  unspecified type    Rx / DC Orders ED Discharge Orders    None       Lois Ostrom, [facility], MD 05/29/20 2344

## 2020-05-30 ENCOUNTER — Inpatient Hospital Stay (HOSPITAL_BASED_OUTPATIENT_CLINIC_OR_DEPARTMENT_OTHER): Payer: Managed Care, Other (non HMO)

## 2020-05-30 ENCOUNTER — Encounter (HOSPITAL_COMMUNITY): Payer: Self-pay | Admitting: Obstetrics and Gynecology

## 2020-05-30 DIAGNOSIS — Z6791 Unspecified blood type, Rh negative: Secondary | ICD-10-CM | POA: Diagnosis not present

## 2020-05-30 DIAGNOSIS — Z3A34 34 weeks gestation of pregnancy: Secondary | ICD-10-CM

## 2020-05-30 DIAGNOSIS — O119 Pre-existing hypertension with pre-eclampsia, unspecified trimester: Secondary | ICD-10-CM

## 2020-05-30 DIAGNOSIS — O99891 Other specified diseases and conditions complicating pregnancy: Secondary | ICD-10-CM

## 2020-05-30 DIAGNOSIS — O288 Other abnormal findings on antenatal screening of mother: Secondary | ICD-10-CM

## 2020-05-30 DIAGNOSIS — O99213 Obesity complicating pregnancy, third trimester: Secondary | ICD-10-CM

## 2020-05-30 DIAGNOSIS — O360130 Maternal care for anti-D [Rh] antibodies, third trimester, not applicable or unspecified: Secondary | ICD-10-CM | POA: Diagnosis not present

## 2020-05-30 DIAGNOSIS — R030 Elevated blood-pressure reading, without diagnosis of hypertension: Secondary | ICD-10-CM | POA: Diagnosis present

## 2020-05-30 DIAGNOSIS — O24415 Gestational diabetes mellitus in pregnancy, controlled by oral hypoglycemic drugs: Secondary | ICD-10-CM

## 2020-05-30 DIAGNOSIS — O10013 Pre-existing essential hypertension complicating pregnancy, third trimester: Secondary | ICD-10-CM | POA: Diagnosis not present

## 2020-05-30 DIAGNOSIS — O133 Gestational [pregnancy-induced] hypertension without significant proteinuria, third trimester: Secondary | ICD-10-CM

## 2020-05-30 DIAGNOSIS — Z3A33 33 weeks gestation of pregnancy: Secondary | ICD-10-CM | POA: Diagnosis not present

## 2020-05-30 DIAGNOSIS — Z7982 Long term (current) use of aspirin: Secondary | ICD-10-CM | POA: Diagnosis not present

## 2020-05-30 DIAGNOSIS — Z20822 Contact with and (suspected) exposure to covid-19: Secondary | ICD-10-CM | POA: Diagnosis not present

## 2020-05-30 DIAGNOSIS — R519 Headache, unspecified: Secondary | ICD-10-CM

## 2020-05-30 DIAGNOSIS — E669 Obesity, unspecified: Secondary | ICD-10-CM

## 2020-05-30 DIAGNOSIS — Z7984 Long term (current) use of oral hypoglycemic drugs: Secondary | ICD-10-CM | POA: Diagnosis not present

## 2020-05-30 DIAGNOSIS — Z79899 Other long term (current) drug therapy: Secondary | ICD-10-CM | POA: Diagnosis not present

## 2020-05-30 DIAGNOSIS — O113 Pre-existing hypertension with pre-eclampsia, third trimester: Secondary | ICD-10-CM | POA: Diagnosis not present

## 2020-05-30 DIAGNOSIS — O26893 Other specified pregnancy related conditions, third trimester: Secondary | ICD-10-CM | POA: Diagnosis present

## 2020-05-30 HISTORY — DX: Pre-existing hypertension with pre-eclampsia, unspecified trimester: O11.9

## 2020-05-30 LAB — TYPE AND SCREEN
ABO/RH(D): O NEG
Antibody Screen: POSITIVE

## 2020-05-30 LAB — RESP PANEL BY RT-PCR (FLU A&B, COVID) ARPGX2
Influenza A by PCR: NEGATIVE
Influenza B by PCR: NEGATIVE
SARS Coronavirus 2 by RT PCR: NEGATIVE

## 2020-05-30 LAB — PROTEIN / CREATININE RATIO, URINE
Creatinine, Urine: 31.74 mg/dL
Creatinine, Urine: 33.88 mg/dL
Protein Creatinine Ratio: 4.95 mg/mg{Cre} — ABNORMAL HIGH (ref 0.00–0.15)
Protein Creatinine Ratio: 1.92 mg/mg{Cre} — ABNORMAL HIGH (ref 0.00–0.15)
Total Protein, Urine: 157 mg/dL
Total Protein, Urine: 65 mg/dL

## 2020-05-30 LAB — GLUCOSE, CAPILLARY
Glucose-Capillary: 78 mg/dL (ref 70–99)
Glucose-Capillary: 92 mg/dL (ref 70–99)
Glucose-Capillary: 88 mg/dL (ref 70–99)
Glucose-Capillary: 83 mg/dL (ref 70–99)

## 2020-05-30 MED ORDER — CALCIUM CARBONATE ANTACID 500 MG PO CHEW
2.0000 | CHEWABLE_TABLET | ORAL | Status: DC | PRN
  Administered 2020-05-30: 400 mg via ORAL
  Filled 2020-05-30: qty 2

## 2020-05-30 MED ORDER — ACETAMINOPHEN 325 MG PO TABS
650.0000 mg | ORAL_TABLET | ORAL | Status: DC | PRN
  Administered 2020-05-30: 650 mg via ORAL
  Filled 2020-05-30: qty 2

## 2020-05-30 MED ORDER — NIFEDIPINE ER OSMOTIC RELEASE 30 MG PO TB24
30.0000 mg | ORAL_TABLET | Freq: Every day | ORAL | Status: DC
  Administered 2020-05-30: 30 mg via ORAL
  Filled 2020-05-30: qty 1

## 2020-05-30 MED ORDER — ZOLPIDEM TARTRATE 5 MG PO TABS: 5.0000 mg | ORAL_TABLET | Freq: Every evening | ORAL | Status: DC | PRN

## 2020-05-30 MED ORDER — NIFEDIPINE ER OSMOTIC RELEASE 30 MG PO TB24
30.0000 mg | ORAL_TABLET | Freq: Once | ORAL | Status: AC
  Administered 2020-05-30: 30 mg via ORAL
  Filled 2020-05-30: qty 1

## 2020-05-30 MED ORDER — NIFEDIPINE ER OSMOTIC RELEASE 30 MG PO TB24
60.0000 mg | ORAL_TABLET | Freq: Every day | ORAL | Status: DC
  Administered 2020-05-31: 60 mg via ORAL
  Filled 2020-05-30: qty 2

## 2020-05-30 MED ORDER — BUTALBITAL-APAP-CAFFEINE 50-325-40 MG PO TABS
2.0000 | ORAL_TABLET | Freq: Once | ORAL | Status: AC
  Administered 2020-05-30: 2 via ORAL
  Filled 2020-05-30: qty 2

## 2020-05-30 MED ORDER — PRENATAL MULTIVITAMIN CH
1.0000 | ORAL_TABLET | Freq: Every day | ORAL | Status: DC
  Administered 2020-05-30 – 2020-05-31 (×2): 1 via ORAL
  Filled 2020-05-30 (×2): qty 1

## 2020-05-30 MED ORDER — DOCUSATE SODIUM 100 MG PO CAPS
100.0000 mg | ORAL_CAPSULE | Freq: Every day | ORAL | Status: DC
  Administered 2020-05-30 – 2020-05-31 (×2): 100 mg via ORAL
  Filled 2020-05-30 (×2): qty 1

## 2020-05-30 MED ORDER — METFORMIN HCL 500 MG PO TABS
500.0000 mg | ORAL_TABLET | Freq: Once | ORAL | Status: AC
  Administered 2020-05-30: 500 mg via ORAL
  Filled 2020-05-30: qty 1

## 2020-05-30 NOTE — H&P (Signed)
Katelyn Bonilla is a 29 y.o. female presenting for elevated blood pressure  29 year old gravida 1 para 0 at 33+2 weeks by date of conception based on embryo transfer. Patient's pregnancy has been complicated by IVF pregnancy, initial BMI greater than 40, gestational diabetes, chronic hypertension without medication. The patient has been joint was followed by maternal-fetal medicine. She was seen for routine prenatal visit on December 2 and noted to have mild range blood pressures of 150/108 and 148/110. She was started on Procardia XL 30 mg at that time. On December 3 she presented to [facility] complaining of worsening blood pressures. At [facility] the patient did have 2 severe range blood pressures. Lab work was normal. The decision was made to transfer the patient to [facility] hospital for further evaluation. Her blood pressures since have all been in the mild range. Mild headache responded to Tylenol OB History    Gravida  1   Para      Term      Preterm      AB      Living        SAB      TAB      Ectopic      Multiple      Live Births             Past Medical History:  Diagnosis Date  . Hypertension   . Obesity    BMI >40  . Pregnancy induced hypertension    History reviewed. No pertinent surgical history. Family History: family history is not on file. Social History:  reports that she has never smoked. She has never used smokeless tobacco. She reports that she does not drink alcohol and does not use drugs.     Maternal Diabetes: Yes:  Diabetes Type:  Insulin/Medication controlled Genetic Screening: Normal Maternal Ultrasounds/Referrals: Normal Fetal Ultrasounds or other Referrals:  Referred to Materal Fetal Medicine  Maternal Substance Abuse:  No Significant Maternal Medications:  Meds include: Other: Metformin 500mg  QHS Significant Maternal Lab Results:  Rh negative Other Comments:  None  Review of Systems History   Blood pressure  139/84, pulse 89, temperature 98.1 F (36.7 C), temperature source Oral, resp. rate 18, height 5\' 2"  (1.575 m), weight 96.2 kg, last menstrual period 10/10/2019, SpO2 98 %. Exam Physical Exam  Prenatal labs: ABO, Rh: --/--/O NEG (12/04 0726) Antibody: POS (12/04 0726) Rubella:  Imm RPR:   NR HBsAg:   Neg HIV:   NR GBS:    Results for orders placed or performed during the hospital encounter of 05/29/20 (from the past 24 hour(s))  CBC with Differential     Status: Abnormal   Collection Time: 05/29/20 10:48 PM  Result Value Ref Range   WBC 15.4 (H) 4.0 - 10.5 K/uL   RBC 4.64 3.87 - 5.11 MIL/uL   Hemoglobin 13.8 12.0 - 15.0 g/dL   HCT 14/03/21 36 - 46 %   MCV 85.1 80.0 - 100.0 fL   MCH 29.7 26.0 - 34.0 pg   MCHC 34.9 30.0 - 36.0 g/dL   RDW 14/03/21 09.4 - 70.9 %   Platelets 335 150 - 400 K/uL   nRBC 0.0 0.0 - 0.2 %   Neutrophils Relative % 73 %   Neutro Abs 11.2 (H) 1.7 - 7.7 K/uL   Lymphocytes Relative 17 %   Lymphs Abs 2.6 0.7 - 4.0 K/uL   Monocytes Relative 9 %   Monocytes Absolute 1.4 (H) 0.1 - 1.0  K/uL   Eosinophils Relative 1 %   Eosinophils Absolute 0.1 0.0 - 0.5 K/uL   Basophils Relative 0 %   Basophils Absolute 0.1 0.0 - 0.1 K/uL   Immature Granulocytes 0 %   Abs Immature Granulocytes 0.05 0.00 - 0.07 K/uL  Comprehensive metabolic panel     Status: Abnormal   Collection Time: 05/29/20 10:48 PM  Result Value Ref Range   Sodium 132 (L) 135 - 145 mmol/L   Potassium 4.0 3.5 - 5.1 mmol/L   Chloride 102 98 - 111 mmol/L   CO2 18 (L) 22 - 32 mmol/L   Glucose, Bld 98 70 - 99 mg/dL   BUN 12 6 - 20 mg/dL   Creatinine, Ser 6.04 0.44 - 1.00 mg/dL   Calcium 9.1 8.9 - 54.0 mg/dL   Total Protein 6.4 (L) 6.5 - 8.1 g/dL   Albumin 2.8 (L) 3.5 - 5.0 g/dL   AST 24 15 - 41 U/L   ALT 24 0 - 44 U/L   Alkaline Phosphatase 142 (H) 38 - 126 U/L   Total Bilirubin 0.3 0.3 - 1.2 mg/dL   GFR, Estimated >98 >11 mL/min   Anion gap 12 5 - 15  Protein / creatinine ratio, urine     Status: Abnormal    Collection Time: 05/29/20 11:25 PM  Result Value Ref Range   Creatinine, Urine 31.74 mg/dL   Total Protein, Urine 157 mg/dL   Protein Creatinine Ratio 4.95 (H) 0.00 - 0.15 mg/mg[Cre]  Urinalysis, Routine w reflex microscopic Urine, Clean Catch     Status: Abnormal   Collection Time: 05/29/20 11:25 PM  Result Value Ref Range   Color, Urine YELLOW YELLOW   APPearance CLEAR CLEAR   Specific Gravity, Urine 1.010 1.005 - 1.030   pH 6.5 5.0 - 8.0   Glucose, UA NEGATIVE NEGATIVE mg/dL   Hgb urine dipstick NEGATIVE NEGATIVE   Bilirubin Urine NEGATIVE NEGATIVE   Ketones, ur NEGATIVE NEGATIVE mg/dL   Protein, ur 914 (A) NEGATIVE mg/dL   Nitrite NEGATIVE NEGATIVE   Leukocytes,Ua NEGATIVE NEGATIVE  Urinalysis, Microscopic (reflex)     Status: Abnormal   Collection Time: 05/29/20 11:25 PM  Result Value Ref Range   RBC / HPF NONE SEEN 0 - 5 RBC/hpf   WBC, UA 0-5 0 - 5 WBC/hpf   Bacteria, UA MANY (A) NONE SEEN   Squamous Epithelial / LPF 0-5 0 - 5  Resp Panel by RT-PCR (Flu A&B, Covid)     Status: None   Collection Time: 05/30/20 12:00 AM  Result Value Ref Range   SARS Coronavirus 2 by RT PCR NEGATIVE NEGATIVE   Influenza A by PCR NEGATIVE NEGATIVE   Influenza B by PCR NEGATIVE NEGATIVE  Protein / creatinine ratio, urine     Status: Abnormal   Collection Time: 05/30/20  4:57 AM  Result Value Ref Range   Creatinine, Urine 33.88 mg/dL   Total Protein, Urine 65 mg/dL   Protein Creatinine Ratio 1.92 (H) 0.00 - 0.15 mg/mg[Cre]  Type and screen [facility]     Status: None   Collection Time: 05/30/20  7:26 AM  Result Value Ref Range   ABO/RH(D) O NEG    Antibody Screen POS    Sample Expiration 06/02/2020,2359    Antibody Identification      PASSIVELY ACQUIRED ANTI-D Performed at [facility], [address], [place], [place] [address]   Glucose, capillary     Status: None   Collection Time: 05/30/20  7:58 AM  Result Value Ref Range   Glucose-Capillary 78 70 -  99 mg/dL  Glucose, capillary     Status: None   Collection Time: 05/30/20 11:05 AM  Result Value Ref Range   Glucose-Capillary 92 70 - 99 mg/dL     Assessment/Plan: 29 year old G1, P0 33+2 by embryo transfer with chronic hypertension and superimposed preeclampsia 1) admit for blood pressure monitoring 2) initially started on Procardia 30 mg XL. Will increase to 60 mg XL. 3) urine protein creatinine ratio elevated. Initial protein creatinine at [facility] was 4.95, repeat value performed due to late results at [facility] was 1.92. All other preeclampsia labs were normal. 4) BPP on arrival 8 out of 8 with normal fluid due to flat tracing. Fetal monitoring has been reassuring with a reactive tracing, category 1 5) blood sugars to this point have been at goal 6) anticipate being able to discharge patient home tomorrow   Waynard Reeds 05/30/2020, 12:53 PM

## 2020-05-30 NOTE — MAU Note (Signed)
Pt arrived via EMS from [facility] with c/o h/a & HTN.  Pt states h/a across frontal to left temporal & radiates to back of neck as 8 on 0/10 pain scale.  Denies visual disturbances or epigastric pain.  DTRs +1, non pitting edema noted BLE.  Denies UCs, lof, vb; states +FM.

## 2020-05-30 NOTE — MAU Provider Note (Signed)
History     CSN: [id number]  Arrival date and time: 05/29/20 2222   First Provider Initiated Contact with Patient 05/30/20 [phone]      Chief Complaint  Patient presents with  . Headache   Katelyn Bonilla is a 29 y.o. G1P0 at [redacted]w[redacted]d who receives care at [facility].  She presents today for Headache.  She states she took her Procardia (30mg ) at 2pm and had onset of HA at 5pm.  She states she did not take anything for her headache.  She states the headache is located "mostly on the left side and goes around to the back and the full head hurts."  She describes the pain as a throbbing and rates it an 8/10. She denies N/V.  She denies relieving factors, but notes it is aggravated by "moving around."  Patient endorses fetal movement and denies abdominal cramping or contractions. Patient reports last BS was 104 or 103 after lunch.    OB History    Gravida  1   Para      Term      Preterm      AB      Living        SAB      TAB      Ectopic      Multiple      Live Births              Past Medical History:  Diagnosis Date  . Hypertension   . Obesity    BMI >40  . Pregnancy induced hypertension     History reviewed. No pertinent surgical history.  History reviewed. No pertinent family history.  Social History   Tobacco Use  . Smoking status: Never Smoker  . Smokeless tobacco: Never Used  Vaping Use  . Vaping Use: Never used  Substance Use Topics  . Alcohol use: No  . Drug use: No    Allergies: No Known Allergies  Medications Prior to Admission  Medication Sig Dispense Refill Last Dose  . NIFEdipine (PROCARDIA-XL/NIFEDICAL-XL) 30 MG 24 hr tablet Take 30 mg by mouth daily.     aspirin EC 81 MG tablet Take 81 mg by mouth daily. Swallow whole.     . Cholecalciferol (VITAMIN D3) 1.25 MG (50000 UT) CAPS Take by mouth.     Marland Kitchen ibuprofen (ADVIL,MOTRIN) 200 MG tablet Take 600 mg by mouth every 6 (six) hours as needed. For back pain  (Patient not taking:  Reported on 05/06/2020)     . metFORMIN (GLUCOPHAGE) 500 MG tablet Take 500 mg by mouth once.     13/03/2020 omeprazole (PRILOSEC) 20 MG capsule Take 20 mg by mouth daily.     . Prenatal Vit-Fe Fumarate-FA (PRENATAL MULTIVITAMIN) TABS tablet Take 1 tablet by mouth daily at 12 noon.       Review of Systems  Constitutional: Negative for chills and fever.  Eyes: Negative for visual disturbance.  Respiratory: Negative for cough and shortness of breath.   Gastrointestinal: Negative for abdominal pain, nausea and vomiting.  Genitourinary: Negative for difficulty urinating, dysuria, pelvic pain, vaginal bleeding and vaginal discharge.  Musculoskeletal: Negative for back pain.  Neurological: Positive for headaches. Negative for dizziness and light-headedness.   Physical Exam   Blood pressure (!) 151/94, pulse 94, temperature 98 F (36.7 C), temperature source Oral, resp. rate 20, height 5\' 2"  (1.575 m), weight 96.2 kg, last menstrual period 10/10/2019, SpO2 99 %. Vitals:   05/30/20 10/12/2019 05/30/20 0546 05/30/20 0601  05/30/20 0646  BP: (!) 152/98 (!) 146/92 (!) 147/86 (!) 148/90  Pulse: 88 85 86 75  Resp:      Temp:      TempSrc:      SpO2:      Weight:      Height:        Physical Exam Vitals reviewed.  Constitutional:      General: She is not in acute distress.    Appearance: She is well-developed. She is obese. She is not ill-appearing.  HENT:     Head: Normocephalic and atraumatic.  Eyes:     Conjunctiva/sclera: Conjunctivae normal.  Cardiovascular:     Rate and Rhythm: Normal rate and regular rhythm.     Heart sounds: Normal heart sounds.  Pulmonary:     Effort: Pulmonary effort is normal. No respiratory distress.     Breath sounds: Normal breath sounds.  Abdominal:     General: Bowel sounds are normal.     Palpations: Abdomen is soft.     Tenderness: There is no abdominal tenderness.     Comments: Gravid  Musculoskeletal:        General: Normal range of motion.     Cervical  back: Normal range of motion.     Right lower leg: Edema present.     Left lower leg: Edema present.  Skin:    General: Skin is warm and dry.  Neurological:     Mental Status: She is alert and oriented to person, place, and time.     Deep Tendon Reflexes: Reflexes normal.  Psychiatric:        Mood and Affect: Mood normal.        Speech: Speech normal.        Behavior: Behavior normal.     Fetal Assessment 150 bpm, Mod Var, -Decels, -Accels Toco: Irritability  MAU Course   Results for orders placed or performed during the hospital encounter of 05/29/20 (from the past 24 hour(s))  CBC with Differential     Status: Abnormal   Collection Time: 05/29/20 10:48 PM  Result Value Ref Range   WBC 15.4 (H) 4.0 - 10.5 K/uL   RBC 4.64 3.87 - 5.11 MIL/uL   Hemoglobin 13.8 12.0 - 15.0 g/dL   HCT 51.0 36 - 46 %   MCV 85.1 80.0 - 100.0 fL   MCH 29.7 26.0 - 34.0 pg   MCHC 34.9 30.0 - 36.0 g/dL   RDW 25.8 52.7 - 78.2 %   Platelets 335 150 - 400 K/uL   nRBC 0.0 0.0 - 0.2 %   Neutrophils Relative % 73 %   Neutro Abs 11.2 (H) 1.7 - 7.7 K/uL   Lymphocytes Relative 17 %   Lymphs Abs 2.6 0.7 - 4.0 K/uL   Monocytes Relative 9 %   Monocytes Absolute 1.4 (H) 0.1 - 1.0 K/uL   Eosinophils Relative 1 %   Eosinophils Absolute 0.1 0.0 - 0.5 K/uL   Basophils Relative 0 %   Basophils Absolute 0.1 0.0 - 0.1 K/uL   Immature Granulocytes 0 %   Abs Immature Granulocytes 0.05 0.00 - 0.07 K/uL  Comprehensive metabolic panel     Status: Abnormal   Collection Time: 05/29/20 10:48 PM  Result Value Ref Range   Sodium 132 (L) 135 - 145 mmol/L   Potassium 4.0 3.5 - 5.1 mmol/L   Chloride 102 98 - 111 mmol/L   CO2 18 (L) 22 - 32 mmol/L   Glucose, Bld 98 70 -  99 mg/dL   BUN 12 6 - 20 mg/dL   Creatinine, Ser 6.94 0.44 - 1.00 mg/dL   Calcium 9.1 8.9 - 85.4 mg/dL   Total Protein 6.4 (L) 6.5 - 8.1 g/dL   Albumin 2.8 (L) 3.5 - 5.0 g/dL   AST 24 15 - 41 U/L   ALT 24 0 - 44 U/L   Alkaline Phosphatase 142 (H) 38  - 126 U/L   Total Bilirubin 0.3 0.3 - 1.2 mg/dL   GFR, Estimated >62 >70 mL/min   Anion gap 12 5 - 15  Urinalysis, Routine w reflex microscopic Urine, Clean Catch     Status: Abnormal   Collection Time: 05/29/20 11:25 PM  Result Value Ref Range   Color, Urine YELLOW YELLOW   APPearance CLEAR CLEAR   Specific Gravity, Urine 1.010 1.005 - 1.030   pH 6.5 5.0 - 8.0   Glucose, UA NEGATIVE NEGATIVE mg/dL   Hgb urine dipstick NEGATIVE NEGATIVE   Bilirubin Urine NEGATIVE NEGATIVE   Ketones, ur NEGATIVE NEGATIVE mg/dL   Protein, ur 350 (A) NEGATIVE mg/dL   Nitrite NEGATIVE NEGATIVE   Leukocytes,Ua NEGATIVE NEGATIVE  Urinalysis, Microscopic (reflex)     Status: Abnormal   Collection Time: 05/29/20 11:25 PM  Result Value Ref Range   RBC / HPF NONE SEEN 0 - 5 RBC/hpf   WBC, UA 0-5 0 - 5 WBC/hpf   Bacteria, UA MANY (A) NONE SEEN   Squamous Epithelial / LPF 0-5 0 - 5  Resp Panel by RT-PCR (Flu A&B, Covid)     Status: None   Collection Time: 05/30/20 12:00 AM  Result Value Ref Range   SARS Coronavirus 2 by RT PCR NEGATIVE NEGATIVE   Influenza A by PCR NEGATIVE NEGATIVE   Influenza B by PCR NEGATIVE NEGATIVE      MDM PE Labs: PC Ratio EFM Pain Medication Assessment and Plan  29 year old G1P0  SIUP at 34.2weeks Cat I FT Chronic HTN Headache GDM  -Labs from [facility] reviewed and without concern, thus far. -PC Ratio from [facility] pending.  Will order as nurse from [facility] states this lab is sent out.  -POC discussed. -Will give fioricet for HA. -NST reviewed and non reactive. -Will send for BPP. -Exam performed. -Dr. Earlene Plater consulted and informed of patient status and interventions. Advised: *Recommend admission for monitor and stabilization of blood pressures if HA not greatly improved and/or if BPP not reassuring. *If HA improved okay for discharge, but with very close follow-up. -Will await results and plan as above  Cherre Robins MSN, CNM 05/30/2020, 5:15 AM   Reassessment (7:00  AM)  -BPP returns 8/8 -Patient reports HA now 4/8 -Dr. Tenny Craw consulted and updated on patient status.  States that patient will be admitted for observation.   -Provider offers and places standard antenatal admission orders including patient home meds. -Nurse and patient updated on POC.  Cherre Robins MSN, CNM Advanced Practice Provider, [facility] [employer]

## 2020-05-31 LAB — GLUCOSE, CAPILLARY
Glucose-Capillary: 66 mg/dL — ABNORMAL LOW (ref 70–99)
Glucose-Capillary: 89 mg/dL (ref 70–99)

## 2020-05-31 MED ORDER — LABETALOL HCL 5 MG/ML IV SOLN: 40.0000 mg | INTRAVENOUS | Status: DC | PRN

## 2020-05-31 MED ORDER — LABETALOL HCL 5 MG/ML IV SOLN: 80.0000 mg | INTRAVENOUS | Status: DC | PRN

## 2020-05-31 MED ORDER — HYDRALAZINE HCL 20 MG/ML IJ SOLN: 10.0000 mg | INTRAMUSCULAR | Status: DC | PRN

## 2020-05-31 MED ORDER — LABETALOL HCL 5 MG/ML IV SOLN
20.0000 mg | INTRAVENOUS | Status: DC | PRN
  Filled 2020-05-31 (×2): qty 4

## 2020-05-31 MED ORDER — NIFEDIPINE ER 60 MG PO TB24: 60.0000 mg | ORAL_TABLET | Freq: Every day | ORAL | 2 refills | Status: DC

## 2020-05-31 MED ORDER — METFORMIN HCL 500 MG PO TABS
500.0000 mg | ORAL_TABLET | Freq: Once | ORAL | Status: AC
  Administered 2020-05-31: 500 mg via ORAL
  Filled 2020-05-31: qty 1

## 2020-05-31 NOTE — Progress Notes (Signed)
Hypoglycemic Event  CBG: 66; fasting  Treatment: Cheree Ditto crackers  Symptoms: None  Follow-up CBG: Time: 0613 CBG Result: 89  Comments/MD notified: Dr. Levester Fresh

## 2020-05-31 NOTE — Progress Notes (Signed)
Reviewed antepartum discharge instructions with patient regarding medications, when to call MD/go to MAU, and to follow up as schedule with OB. Patient verbalized understanding of discharge instructions and asked appropriate questions.

## 2020-05-31 NOTE — Discharge Summary (Addendum)
Antepartum Discharge Summary       Patient Name: Katelyn Bonilla DOB: 07-Mar-1991 MRN: [id number]  Date of admission: 05/29/2020 Delivery date:This patient has no babies on file. Delivering provider: This patient has no babies on file. Date of discharge: 05/31/2020  Admitting diagnosis: Acute nonintractable headache, unspecified headache type [R51.9] Hypertension, unspecified type [I10] Chronic hypertension with superimposed preeclampsia [O11.9] Intrauterine pregnancy: [redacted]w[redacted]d     Secondary diagnosis:  Active Problems:   Chronic hypertension with superimposed preeclampsia  Additional problems: Gestational diabetes, BMI 40+    Discharge diagnosis: CHTN with superimposed preeclampsia                                               Hospital course: Observation on antenatal with fetal monitoring and antihypertensive treatment   Physical exam  Vitals:   05/31/20 0013 05/31/20 0616 05/31/20 0631 05/31/20 0910  BP: (!) 158/81 (!) 165/91 (!) 157/87 (!) 142/94  Pulse: 93 100 97 96  Resp:  20  18  Temp:  98.3 F (36.8 C)  98 F (36.7 C)  TempSrc:  Oral  Axillary  SpO2:  98%  99%  Weight:      Height:       General: alert, cooperative and no distress NST reactive, cat 1 tracing  Labs: Lab Results  Component Value Date   WBC 15.4 (H) 05/29/2020   HGB 13.8 05/29/2020   HCT 39.5 05/29/2020   MCV 85.1 05/29/2020   PLT 335 05/29/2020   CMP Latest Ref Rng & Units 05/29/2020  Glucose 70 - 99 mg/dL 98  BUN 6 - 20 mg/dL 12  Creatinine 4.74 - 2.59 mg/dL 5.63  Sodium 875 - 643 mmol/L 132(L)  Potassium 3.5 - 5.1 mmol/L 4.0  Chloride 98 - 111 mmol/L 102  CO2 22 - 32 mmol/L 18(L)  Calcium 8.9 - 10.3 mg/dL 9.1  Total Protein 6.5 - 8.1 g/dL 6.4(L)  Total Bilirubin 0.3 - 1.2 mg/dL 0.3  Alkaline Phos 38 - 126 U/L 142(H)  AST 15 - 41 U/L 24  ALT 0 - 44 U/L 24   Edinburgh Score: No flowsheet data found.    After visit meds:  Allergies as of 05/31/2020   No Known Allergies      Medication List    STOP taking these medications   ibuprofen 200 MG tablet Commonly known as: ADVIL     TAKE these medications   aspirin EC 81 MG tablet Take 81 mg by mouth daily. Swallow whole.   metFORMIN 500 MG tablet Commonly known as: GLUCOPHAGE Take 500 mg by mouth once.   NIFEdipine 60 MG 24 hr tablet Commonly known as: ADALAT CC Take 1 tablet (60 mg total) by mouth daily. What changed:   medication strength  how much to take   omeprazole 20 MG capsule Commonly known as: PRILOSEC Take 20 mg by mouth daily.   prenatal multivitamin Tabs tablet Take 1 tablet by mouth daily at 12 noon.   Vitamin D3 1.25 MG (50000 UT) Caps Take by mouth.      Future Appointments: Future Appointments  Date Time Provider Department Center  06/02/2020  7:45 AM [facility] NURSE [facility]  06/02/2020  8:00 AM [facility] US1 [facility]  06/11/2020  8:15 AM [facility] NURSE [facility]  06/11/2020  8:30 AM [facility] US3 [facility]  06/16/2020  8:00 AM [facility]  NURSE [facility]  06/16/2020  8:15 AM [facility] US2 [facility]  06/24/2020  8:00 AM [facility] NURSE [facility]  06/24/2020  8:15 AM [facility] US2 [facility]   Follow up Visit:  Follow-up Information    Waynard Reeds, MD Follow up on 06/02/2020.   Specialty: Obstetrics and Gynecology Why: As previously Scheduled Contact information: [address] [place] [address] [phone]                   05/31/2020 Waynard Reeds, MD

## 2020-06-02 ENCOUNTER — Encounter: Payer: Self-pay | Admitting: *Deleted

## 2020-06-02 ENCOUNTER — Other Ambulatory Visit: Payer: Self-pay

## 2020-06-02 ENCOUNTER — Ambulatory Visit: Payer: Managed Care, Other (non HMO) | Admitting: *Deleted

## 2020-06-02 ENCOUNTER — Ambulatory Visit: Payer: Managed Care, Other (non HMO) | Attending: Obstetrics and Gynecology

## 2020-06-02 ENCOUNTER — Other Ambulatory Visit: Payer: Self-pay | Admitting: *Deleted

## 2020-06-02 VITALS — BP 140/86 | HR 98

## 2020-06-02 DIAGNOSIS — Z3A33 33 weeks gestation of pregnancy: Secondary | ICD-10-CM

## 2020-06-02 DIAGNOSIS — O24419 Gestational diabetes mellitus in pregnancy, unspecified control: Secondary | ICD-10-CM | POA: Insufficient documentation

## 2020-06-02 DIAGNOSIS — E669 Obesity, unspecified: Secondary | ICD-10-CM

## 2020-06-02 DIAGNOSIS — O133 Gestational [pregnancy-induced] hypertension without significant proteinuria, third trimester: Secondary | ICD-10-CM | POA: Diagnosis not present

## 2020-06-02 DIAGNOSIS — O360190 Maternal care for anti-D [Rh] antibodies, unspecified trimester, not applicable or unspecified: Secondary | ICD-10-CM | POA: Diagnosis not present

## 2020-06-02 DIAGNOSIS — O24415 Gestational diabetes mellitus in pregnancy, controlled by oral hypoglycemic drugs: Secondary | ICD-10-CM | POA: Insufficient documentation

## 2020-06-02 DIAGNOSIS — O99213 Obesity complicating pregnancy, third trimester: Secondary | ICD-10-CM

## 2020-06-02 NOTE — Progress Notes (Signed)
Was admitted to hospital 05/29/20-05/31/20 for elevated BP's. Started on Nifedipine.

## 2020-06-11 ENCOUNTER — Other Ambulatory Visit: Payer: Self-pay

## 2020-06-11 ENCOUNTER — Encounter: Payer: Self-pay | Admitting: *Deleted

## 2020-06-11 ENCOUNTER — Inpatient Hospital Stay (HOSPITAL_COMMUNITY): Payer: Managed Care, Other (non HMO) | Admitting: Anesthesiology

## 2020-06-11 ENCOUNTER — Encounter (HOSPITAL_COMMUNITY): Payer: Self-pay | Admitting: Obstetrics and Gynecology

## 2020-06-11 ENCOUNTER — Ambulatory Visit (HOSPITAL_BASED_OUTPATIENT_CLINIC_OR_DEPARTMENT_OTHER): Payer: Managed Care, Other (non HMO)

## 2020-06-11 ENCOUNTER — Inpatient Hospital Stay (HOSPITAL_COMMUNITY)
Admission: AD | Admit: 2020-06-11 | Discharge: 2020-06-14 | DRG: 768 | Disposition: A | Discharge status: Home / Self Care | Payer: Managed Care, Other (non HMO) | Attending: Obstetrics and Gynecology | Admitting: Obstetrics and Gynecology

## 2020-06-11 ENCOUNTER — Ambulatory Visit: Payer: Managed Care, Other (non HMO) | Admitting: *Deleted

## 2020-06-11 VITALS — BP 139/85 | HR 88

## 2020-06-11 DIAGNOSIS — O99213 Obesity complicating pregnancy, third trimester: Secondary | ICD-10-CM

## 2020-06-11 DIAGNOSIS — O10913 Unspecified pre-existing hypertension complicating pregnancy, third trimester: Secondary | ICD-10-CM | POA: Insufficient documentation

## 2020-06-11 DIAGNOSIS — O113 Pre-existing hypertension with pre-eclampsia, third trimester: Secondary | ICD-10-CM

## 2020-06-11 DIAGNOSIS — O360193 Maternal care for anti-D [Rh] antibodies, unspecified trimester, fetus 3: Secondary | ICD-10-CM

## 2020-06-11 DIAGNOSIS — O99214 Obesity complicating childbirth: Secondary | ICD-10-CM | POA: Diagnosis present

## 2020-06-11 DIAGNOSIS — O26893 Other specified pregnancy related conditions, third trimester: Secondary | ICD-10-CM | POA: Diagnosis present

## 2020-06-11 DIAGNOSIS — O3663X0 Maternal care for excessive fetal growth, third trimester, not applicable or unspecified: Secondary | ICD-10-CM | POA: Diagnosis present

## 2020-06-11 DIAGNOSIS — O42919 Preterm premature rupture of membranes, unspecified as to length of time between rupture and onset of labor, unspecified trimester: Secondary | ICD-10-CM | POA: Diagnosis present

## 2020-06-11 DIAGNOSIS — Z6791 Unspecified blood type, Rh negative: Secondary | ICD-10-CM

## 2020-06-11 DIAGNOSIS — Z3A35 35 weeks gestation of pregnancy: Secondary | ICD-10-CM

## 2020-06-11 DIAGNOSIS — O1414 Severe pre-eclampsia complicating childbirth: Secondary | ICD-10-CM | POA: Diagnosis present

## 2020-06-11 DIAGNOSIS — E669 Obesity, unspecified: Secondary | ICD-10-CM

## 2020-06-11 DIAGNOSIS — O24429 Gestational diabetes mellitus in childbirth, unspecified control: Secondary | ICD-10-CM | POA: Diagnosis present

## 2020-06-11 DIAGNOSIS — O24415 Gestational diabetes mellitus in pregnancy, controlled by oral hypoglycemic drugs: Secondary | ICD-10-CM

## 2020-06-11 DIAGNOSIS — Z20822 Contact with and (suspected) exposure to covid-19: Secondary | ICD-10-CM | POA: Diagnosis present

## 2020-06-11 DIAGNOSIS — O42913 Preterm premature rupture of membranes, unspecified as to length of time between rupture and onset of labor, third trimester: Secondary | ICD-10-CM | POA: Diagnosis present

## 2020-06-11 DIAGNOSIS — O10919 Unspecified pre-existing hypertension complicating pregnancy, unspecified trimester: Secondary | ICD-10-CM | POA: Insufficient documentation

## 2020-06-11 DIAGNOSIS — O1493 Unspecified pre-eclampsia, third trimester: Secondary | ICD-10-CM

## 2020-06-11 HISTORY — DX: Preterm premature rupture of membranes, unspecified as to length of time between rupture and onset of labor, unspecified trimester: O42.919

## 2020-06-11 LAB — COMPREHENSIVE METABOLIC PANEL
ALT: 24 U/L (ref 0–44)
AST: 25 U/L (ref 15–41)
Albumin: 1.9 g/dL — ABNORMAL LOW (ref 3.5–5.0)
Alkaline Phosphatase: 147 U/L — ABNORMAL HIGH (ref 38–126)
Anion gap: 16 — ABNORMAL HIGH (ref 5–15)
BUN: 18 mg/dL (ref 6–20)
CO2: 15 mmol/L — ABNORMAL LOW (ref 22–32)
Calcium: 8.8 mg/dL — ABNORMAL LOW (ref 8.9–10.3)
Chloride: 103 mmol/L (ref 98–111)
Creatinine, Ser: 0.82 mg/dL (ref 0.44–1.00)
GFR, Estimated: >60 mL/min (ref >60)
Glucose, Bld: 135 mg/dL — ABNORMAL HIGH (ref 70–99)
Potassium: 4 mmol/L (ref 3.5–5.1)
Sodium: 134 mmol/L — ABNORMAL LOW (ref 135–145)
Total Bilirubin: 0.4 mg/dL (ref 0.3–1.2)
Total Protein: 5.2 g/dL — ABNORMAL LOW (ref 6.5–8.1)

## 2020-06-11 LAB — CBC
HCT: 40 % (ref 36.0–46.0)
Hemoglobin: 13.7 g/dL (ref 12.0–15.0)
MCH: 29.1 pg (ref 26.0–34.0)
MCHC: 34.3 g/dL (ref 30.0–36.0)
MCV: 84.9 fL (ref 80.0–100.0)
Platelets: 395 10*3/uL (ref 150–400)
RBC: 4.71 MIL/uL (ref 3.87–5.11)
RDW: 13.2 % (ref 11.5–15.5)
WBC: 15.5 10*3/uL — ABNORMAL HIGH (ref 4.0–10.5)
nRBC: 0 % (ref 0.0–0.2)

## 2020-06-11 LAB — RESP PANEL BY RT-PCR (FLU A&B, COVID) ARPGX2
Influenza A by PCR: NEGATIVE
Influenza B by PCR: NEGATIVE
SARS Coronavirus 2 by RT PCR: NEGATIVE

## 2020-06-11 LAB — POCT FERN TEST: POCT Fern Test: POSITIVE

## 2020-06-11 LAB — GLUCOSE, CAPILLARY
Glucose-Capillary: 119 mg/dL — ABNORMAL HIGH (ref 70–99)
Glucose-Capillary: 91 mg/dL (ref 70–99)

## 2020-06-11 MED ORDER — DIPHENHYDRAMINE HCL 50 MG/ML IJ SOLN: 12.5000 mg | INTRAMUSCULAR | Status: DC | PRN

## 2020-06-11 MED ORDER — EPHEDRINE 5 MG/ML INJ: 10.0000 mg | INTRAVENOUS | Status: DC | PRN

## 2020-06-11 MED ORDER — MAGNESIUM SULFATE 40 GM/1000ML IV SOLN
1.0000 g/h | INTRAVENOUS | Status: AC
  Administered 2020-06-12: 10:00:00 2 g/h via INTRAVENOUS
  Filled 2020-06-11 (×2): qty 1000

## 2020-06-11 MED ORDER — PHENYLEPHRINE 40 MCG/ML (10ML) SYRINGE FOR IV PUSH (FOR BLOOD PRESSURE SUPPORT): 80.0000 ug | PREFILLED_SYRINGE | INTRAVENOUS | Status: DC | PRN

## 2020-06-11 MED ORDER — SODIUM CHLORIDE 0.9 % IV SOLN
5.0000 10*6.[IU] | Freq: Once | INTRAVENOUS | Status: AC
  Administered 2020-06-11: 18:00:00 5 10*6.[IU] via INTRAVENOUS
  Filled 2020-06-11: qty 5

## 2020-06-11 MED ORDER — ACETAMINOPHEN 325 MG PO TABS: 650.0000 mg | ORAL_TABLET | ORAL | Status: DC | PRN

## 2020-06-11 MED ORDER — OXYCODONE-ACETAMINOPHEN 5-325 MG PO TABS: 1.0000 | ORAL_TABLET | ORAL | Status: DC | PRN

## 2020-06-11 MED ORDER — LACTATED RINGERS IV SOLN: 500.0000 mL | INTRAVENOUS | Status: DC | PRN

## 2020-06-11 MED ORDER — LACTATED RINGERS IV SOLN: INTRAVENOUS | Status: DC

## 2020-06-11 MED ORDER — OXYCODONE-ACETAMINOPHEN 5-325 MG PO TABS
2.0000 | ORAL_TABLET | ORAL | Status: DC | PRN
  Administered 2020-06-12: 2 via ORAL
  Filled 2020-06-11: qty 2

## 2020-06-11 MED ORDER — LABETALOL HCL 5 MG/ML IV SOLN
40.0000 mg | INTRAVENOUS | Status: DC | PRN
  Administered 2020-06-11 – 2020-06-12 (×2): 40 mg via INTRAVENOUS
  Filled 2020-06-11 (×2): qty 8

## 2020-06-11 MED ORDER — TERBUTALINE SULFATE 1 MG/ML IJ SOLN: 0.2500 mg | Freq: Once | INTRAMUSCULAR | Status: DC | PRN

## 2020-06-11 MED ORDER — OXYTOCIN BOLUS FROM INFUSION
333.0000 mL | Freq: Once | INTRAVENOUS | Status: AC
  Administered 2020-06-12: 04:00:00 333 mL via INTRAVENOUS

## 2020-06-11 MED ORDER — BUTORPHANOL TARTRATE 1 MG/ML IJ SOLN
1.0000 mg | INTRAMUSCULAR | Status: DC | PRN
  Administered 2020-06-11: 1 mg via INTRAVENOUS
  Filled 2020-06-11: qty 1

## 2020-06-11 MED ORDER — OXYTOCIN-SODIUM CHLORIDE 30-0.9 UT/500ML-% IV SOLN
2.5000 [IU]/h | INTRAVENOUS | Status: DC
  Administered 2020-06-12: 04:00:00 2.5 [IU]/h via INTRAVENOUS
  Filled 2020-06-11: qty 500

## 2020-06-11 MED ORDER — FENTANYL-BUPIVACAINE-NACL 0.5-0.125-0.9 MG/250ML-% EP SOLN: 12.0000 mL/h | EPIDURAL | Status: DC | PRN

## 2020-06-11 MED ORDER — LABETALOL HCL 200 MG PO TABS: 400.0000 mg | ORAL_TABLET | Freq: Every day | ORAL | Status: DC

## 2020-06-11 MED ORDER — MAGNESIUM SULFATE BOLUS VIA INFUSION
4.0000 g | Freq: Once | INTRAVENOUS | Status: AC
  Administered 2020-06-11: 18:00:00 4 g via INTRAVENOUS
  Filled 2020-06-11: qty 1000

## 2020-06-11 MED ORDER — LACTATED RINGERS IV SOLN: 500.0000 mL | Freq: Once | INTRAVENOUS | Status: DC

## 2020-06-11 MED ORDER — LABETALOL HCL 5 MG/ML IV SOLN
20.0000 mg | INTRAVENOUS | Status: DC | PRN
  Administered 2020-06-11 – 2020-06-12 (×2): 20 mg via INTRAVENOUS
  Filled 2020-06-11 (×2): qty 4

## 2020-06-11 MED ORDER — HYDRALAZINE HCL 20 MG/ML IJ SOLN: 10.0000 mg | INTRAMUSCULAR | Status: DC | PRN

## 2020-06-11 MED ORDER — OXYTOCIN-SODIUM CHLORIDE 30-0.9 UT/500ML-% IV SOLN
1.0000 m[IU]/min | INTRAVENOUS | Status: DC
  Administered 2020-06-11: 19:00:00 2 m[IU]/min via INTRAVENOUS

## 2020-06-11 MED ORDER — FENTANYL-BUPIVACAINE-NACL 0.5-0.125-0.9 MG/250ML-% EP SOLN
12.0000 mL/h | EPIDURAL | Status: DC | PRN
  Filled 2020-06-11: qty 250

## 2020-06-11 MED ORDER — SODIUM CHLORIDE (PF) 0.9 % IJ SOLN
INTRAMUSCULAR | Status: DC | PRN
  Administered 2020-06-11: 12 mL/h via EPIDURAL

## 2020-06-11 MED ORDER — LABETALOL HCL 5 MG/ML IV SOLN
80.0000 mg | INTRAVENOUS | Status: DC | PRN
  Administered 2020-06-12: 03:00:00 80 mg via INTRAVENOUS
  Filled 2020-06-11: qty 16

## 2020-06-11 MED ORDER — LIDOCAINE HCL (PF) 1 % IJ SOLN
INTRAMUSCULAR | Status: DC | PRN
  Administered 2020-06-11 (×2): 4 mL via EPIDURAL

## 2020-06-11 MED ORDER — SOD CITRATE-CITRIC ACID 500-334 MG/5ML PO SOLN: 30.0000 mL | ORAL | Status: DC | PRN

## 2020-06-11 MED ORDER — PENICILLIN G POT IN DEXTROSE 60000 UNIT/ML IV SOLN
3.0000 10*6.[IU] | INTRAVENOUS | Status: DC
  Administered 2020-06-11 – 2020-06-12 (×2): 3 10*6.[IU] via INTRAVENOUS
  Filled 2020-06-11 (×2): qty 50

## 2020-06-11 MED ORDER — LIDOCAINE HCL (PF) 1 % IJ SOLN
30.0000 mL | INTRAMUSCULAR | Status: AC | PRN
  Administered 2020-06-12: 30 mL via SUBCUTANEOUS
  Filled 2020-06-11: qty 30

## 2020-06-11 MED ORDER — FLEET ENEMA 7-19 GM/118ML RE ENEM: 1.0000 | ENEMA | RECTAL | Status: DC | PRN

## 2020-06-11 MED ORDER — LABETALOL HCL 200 MG PO TABS
200.0000 mg | ORAL_TABLET | Freq: Every day | ORAL | Status: DC
  Administered 2020-06-11: 21:00:00 200 mg via ORAL
  Filled 2020-06-11: qty 1

## 2020-06-11 MED ORDER — ONDANSETRON HCL 4 MG/2ML IJ SOLN: 4.0000 mg | Freq: Four times a day (QID) | INTRAMUSCULAR | Status: DC | PRN

## 2020-06-11 NOTE — Anesthesia Preprocedure Evaluation (Signed)
Anesthesia Evaluation  Patient identified by MRN, date of birth, ID band Patient awake    Reviewed: Allergy & Precautions, H&P , NPO status , Patient's Chart, lab work & pertinent test results  Airway Mallampati: II   Neck ROM: full    Dental   Pulmonary neg pulmonary ROS,    breath sounds clear to auscultation       Cardiovascular hypertension,  Rhythm:regular Rate:Normal     Neuro/Psych    GI/Hepatic   Endo/Other  diabetesMorbid obesity  Renal/GU      Musculoskeletal   Abdominal   Peds  Hematology   Anesthesia Other Findings   Reproductive/Obstetrics (+) Pregnancy                             Anesthesia Physical Anesthesia Plan  ASA: II  Anesthesia Plan: Epidural   Post-op Pain Management:    Induction: Intravenous  PONV Risk Score and Plan: 2 and Treatment may vary due to age or medical condition  Airway Management Planned: Natural Airway  Additional Equipment:   Intra-op Plan:   Post-operative Plan:   Informed Consent: I have reviewed the patients History and Physical, chart, labs and discussed the procedure including the risks, benefits and alternatives for the proposed anesthesia with the patient or authorized representative who has indicated his/her understanding and acceptance.       Plan Discussed with: Anesthesiologist  Anesthesia Plan Comments:         Anesthesia Quick Evaluation

## 2020-06-11 NOTE — Anesthesia Procedure Notes (Signed)
Epidural Patient location during procedure: OB Start time: 06/11/2020 8:23 PM End time: 06/11/2020 8:33 PM  Staffing Anesthesiologist: Achille Rich, MD Performed: anesthesiologist   Preanesthetic Checklist Completed: patient identified, IV checked, site marked, risks and benefits discussed, monitors and equipment checked, pre-op evaluation and timeout performed  Epidural Patient position: sitting Prep: DuraPrep Patient monitoring: heart rate, cardiac monitor, continuous pulse ox and blood pressure Approach: midline Location: L2-L3 Injection technique: LOR saline  Needle:  Needle type: Tuohy  Needle gauge: 17 G Needle length: 9 cm Needle insertion depth: 8 cm Catheter type: closed end flexible Catheter size: 19 Gauge Catheter at skin depth: 14 cm Test dose: negative and Other  Assessment Events: blood not aspirated, injection not painful, no injection resistance and negative IV test  Additional Notes Informed consent obtained prior to proceeding including risk of failure, 1% risk of PDPH, risk of minor discomfort and bruising.  Discussed rare but serious complications including epidural abscess, permanent nerve injury, epidural hematoma.  Discussed alternatives to epidural analgesia and patient desires to proceed.  Timeout performed pre-procedure verifying patient name, procedure, and platelet count.  Patient tolerated procedure well. Reason for block:procedure for pain

## 2020-06-11 NOTE — H&P (Addendum)
29 y.o. [redacted]w[redacted]d  G1P0 comes in c/o rupture at 1420, clear fluid.  Otherwise has good fetal movement and no bleeding.  Past Medical History:  Diagnosis Date  . Hypertension   . Obesity    BMI >40  . Pregnancy induced hypertension    History reviewed. No pertinent surgical history.  OB History  Gravida Para Term Preterm AB Living  1            SAB IAB Ectopic Multiple Live Births               # Outcome Date GA Lbr Len/2nd Weight Sex Delivery Anes PTL Lv  1 Current             Social History   Socioeconomic History  . Marital status: Married    Spouse name: Not on file  . Number of children: Not on file  . Years of education: Not on file  . Highest education level: Not on file  Occupational History  . Not on file  Tobacco Use  . Smoking status: Never Smoker  . Smokeless tobacco: Never Used  Vaping Use  . Vaping Use: Never used  Substance and Sexual Activity  . Alcohol use: No  . Drug use: No  . Sexual activity: Yes  Other Topics Concern  . Not on file  Social History Narrative  . Not on file   Social Determinants of Health   Financial Resource Strain: Not on file  Food Insecurity: No Food Insecurity  . Worried About Programme researcher, broadcasting/film/video in the Last Year: Never true  . Ran Out of Food in the Last Year: Never true  Transportation Needs: Not on file  Physical Activity: Not on file  Stress: Not on file  Social Connections: Not on file  Intimate Partner Violence: Not on file   Patient has no known allergies.    Prenatal Transfer Tool  Maternal Diabetes: Yes:  Diabetes Type:  Insulin/Medication controlled Genetic Screening: Normal Maternal Ultrasounds/Referrals: Normal Fetal Ultrasounds or other Referrals:  None Maternal Substance Abuse:  No Significant Maternal Medications:  Meds include: Other: metformin and labetalol Significant Maternal Lab Results: Rh negative  Other PNC: obesity, IVF pregnancy, LGA at 78 weeks-last [place] at 33 weeks was 2981, >99%ile.  BPP  today at MFM was 8/8.     Vitals:   06/11/20 1646 06/11/20 1655 06/11/20 1703 06/11/20 1716  BP: (!) 171/109  (!) 181/101 (!) 157/88  Pulse: 81  80 78  Resp:  (!) 23    Temp:      SpO2:        Lungs/Cor:  NAD Abdomen:  soft, gravid Ex:  no cords, erythema SVE:  1.5/50/-2 per CNM FHTs:  140s, good STV, NST R; Cat 1 tracing. Toco:  q 3 CNM bedside US showed VTX.    A/P   Preterm PROM, clear.  Now severe range BPs, will proceed with induction and start magnesium sulfate.  Receive one dose of labetalol IV.  GBS unknown- PCN.    Loney Laurence

## 2020-06-11 NOTE — Progress Notes (Signed)
Pt comfortable with epidural.  Vitals:   06/11/20 2105 06/11/20 2130 06/11/20 2200 06/11/20 2235  BP: 140/82 (!) 148/86 (!) 155/87 (!) 145/94  Pulse: 72 77 72 75  Resp:  18 20   Temp:   98.2 F (36.8 C)   TempSrc:   Oral   SpO2: 100% 100% 100%   Weight:      Height:       FHTS 120s, gSTV, NST R Toco q2 SVE 2/70/-2  Continue induction for PPROM and severe preeclampsia.

## 2020-06-11 NOTE — Progress Notes (Signed)
Last dose Labetalol 0800 this am

## 2020-06-11 NOTE — MAU Note (Signed)
Pt reports ctx's since 1420.   Pt reports gush of clear fluid at 1420.  Denies vaginal bleeding  Reports +FM

## 2020-06-12 ENCOUNTER — Encounter (HOSPITAL_COMMUNITY): Payer: Self-pay | Admitting: Obstetrics and Gynecology

## 2020-06-12 LAB — CBC
HCT: 34.4 % — ABNORMAL LOW (ref 36.0–46.0)
HCT: 27.8 % — ABNORMAL LOW (ref 36.0–46.0)
Hemoglobin: 11.8 g/dL — ABNORMAL LOW (ref 12.0–15.0)
Hemoglobin: 9.8 g/dL — ABNORMAL LOW (ref 12.0–15.0)
MCH: 29.9 pg (ref 26.0–34.0)
MCH: 30.2 pg (ref 26.0–34.0)
MCHC: 34.3 g/dL (ref 30.0–36.0)
MCHC: 35.3 g/dL (ref 30.0–36.0)
MCV: 87.3 fL (ref 80.0–100.0)
MCV: 85.8 fL (ref 80.0–100.0)
Platelets: 349 10*3/uL (ref 150–400)
Platelets: 323 10*3/uL (ref 150–400)
RBC: 3.94 MIL/uL (ref 3.87–5.11)
RBC: 3.24 MIL/uL — ABNORMAL LOW (ref 3.87–5.11)
RDW: 13.4 % (ref 11.5–15.5)
RDW: 13.5 % (ref 11.5–15.5)
WBC: 22.6 10*3/uL — ABNORMAL HIGH (ref 4.0–10.5)
WBC: 25.2 10*3/uL — ABNORMAL HIGH (ref 4.0–10.5)
nRBC: 0 % (ref 0.0–0.2)
nRBC: 0 % (ref 0.0–0.2)

## 2020-06-12 LAB — DIC (DISSEMINATED INTRAVASCULAR COAGULATION)PANEL
D-Dimer, Quant: 13.47 ug/mL-FEU — ABNORMAL HIGH (ref 0.00–0.50)
Fibrinogen: 515 mg/dL — ABNORMAL HIGH (ref 210–475)
INR: 1 (ref 0.8–1.2)
Platelets: 355 10*3/uL (ref 150–400)
Prothrombin Time: 12.9 seconds (ref 11.4–15.2)
Smear Review: NONE SEEN
aPTT: 29 seconds (ref 24–36)

## 2020-06-12 LAB — MAGNESIUM: Magnesium: 7.4 mg/dL (ref 1.7–2.4)

## 2020-06-12 LAB — RPR: RPR Ser Ql: NONREACTIVE

## 2020-06-12 LAB — POSTPARTUM HEMORRHAGE PROTOCOL (BB NOTIFICATION)

## 2020-06-12 LAB — PREPARE RBC (CROSSMATCH)

## 2020-06-12 LAB — GLUCOSE, CAPILLARY: Glucose-Capillary: 78 mg/dL (ref 70–99)

## 2020-06-12 MED ORDER — DIPHENHYDRAMINE HCL 25 MG PO CAPS: 25.0000 mg | ORAL_CAPSULE | Freq: Four times a day (QID) | ORAL | Status: DC | PRN

## 2020-06-12 MED ORDER — MAGNESIUM HYDROXIDE 400 MG/5ML PO SUSP: 30.0000 mL | ORAL | Status: DC | PRN

## 2020-06-12 MED ORDER — ACETAMINOPHEN 325 MG PO TABS
650.0000 mg | ORAL_TABLET | ORAL | Status: DC | PRN
  Administered 2020-06-12 – 2020-06-14 (×4): 650 mg via ORAL
  Filled 2020-06-12 (×4): qty 2

## 2020-06-12 MED ORDER — BUTORPHANOL TARTRATE 1 MG/ML IJ SOLN
2.0000 mg | Freq: Once | INTRAMUSCULAR | Status: AC
  Administered 2020-06-12: 04:00:00 2 mg via INTRAVENOUS
  Filled 2020-06-12: qty 2

## 2020-06-12 MED ORDER — COCONUT OIL OIL
1.0000 "application " | TOPICAL_OIL | Status: DC | PRN
  Administered 2020-06-13: 1 via TOPICAL

## 2020-06-12 MED ORDER — BENZOCAINE-MENTHOL 20-0.5 % EX AERO
1.0000 "application " | INHALATION_SPRAY | CUTANEOUS | Status: DC | PRN
  Administered 2020-06-13: 1 via TOPICAL
  Filled 2020-06-12: qty 56

## 2020-06-12 MED ORDER — SODIUM CHLORIDE 0.9% FLUSH
3.0000 mL | Freq: Two times a day (BID) | INTRAVENOUS | Status: DC
  Administered 2020-06-12 – 2020-06-13 (×2): 3 mL via INTRAVENOUS

## 2020-06-12 MED ORDER — DIBUCAINE (PERIANAL) 1 % EX OINT: 1.0000 "application " | TOPICAL_OINTMENT | CUTANEOUS | Status: DC | PRN

## 2020-06-12 MED ORDER — OXYCODONE-ACETAMINOPHEN 5-325 MG PO TABS: 2.0000 | ORAL_TABLET | ORAL | Status: DC | PRN

## 2020-06-12 MED ORDER — MISOPROSTOL 200 MCG PO TABS: 1000.0000 ug | ORAL_TABLET | Freq: Once | ORAL | Status: AC

## 2020-06-12 MED ORDER — LABETALOL HCL 200 MG PO TABS
400.0000 mg | ORAL_TABLET | Freq: Two times a day (BID) | ORAL | Status: DC
  Administered 2020-06-12 – 2020-06-14 (×5): 400 mg via ORAL
  Filled 2020-06-12 (×5): qty 2

## 2020-06-12 MED ORDER — IBUPROFEN 800 MG PO TABS
800.0000 mg | ORAL_TABLET | Freq: Three times a day (TID) | ORAL | Status: DC
  Administered 2020-06-12 – 2020-06-14 (×7): 800 mg via ORAL
  Filled 2020-06-12 (×7): qty 1

## 2020-06-12 MED ORDER — LACTATED RINGERS IV SOLN: INTRAVENOUS | Status: DC

## 2020-06-12 MED ORDER — SIMETHICONE 80 MG PO CHEW
80.0000 mg | CHEWABLE_TABLET | ORAL | Status: DC | PRN
Start: 2020-06-12 — End: 2020-06-14
  Administered 2020-06-13: 04:00:00 80 mg via ORAL
  Filled 2020-06-12: qty 1

## 2020-06-12 MED ORDER — SODIUM CHLORIDE 0.9% IV SOLUTION: Freq: Once | INTRAVENOUS | Status: DC

## 2020-06-12 MED ORDER — SENNOSIDES-DOCUSATE SODIUM 8.6-50 MG PO TABS
2.0000 | ORAL_TABLET | Freq: Every day | ORAL | Status: DC
  Administered 2020-06-13 – 2020-06-14 (×2): 2 via ORAL
  Filled 2020-06-12 (×2): qty 2

## 2020-06-12 MED ORDER — SODIUM CHLORIDE 0.9 % IV SOLN: 250.0000 mL | INTRAVENOUS | Status: DC | PRN

## 2020-06-12 MED ORDER — TETANUS-DIPHTH-ACELL PERTUSSIS 5-2.5-18.5 LF-MCG/0.5 IM SUSY: 0.5000 mL | PREFILLED_SYRINGE | Freq: Once | INTRAMUSCULAR | Status: DC

## 2020-06-12 MED ORDER — FERROUS SULFATE 325 (65 FE) MG PO TABS
325.0000 mg | ORAL_TABLET | Freq: Two times a day (BID) | ORAL | Status: DC
  Administered 2020-06-12 – 2020-06-14 (×5): 325 mg via ORAL
  Filled 2020-06-12 (×5): qty 1

## 2020-06-12 MED ORDER — TRANEXAMIC ACID-NACL 1000-0.7 MG/100ML-% IV SOLN
1000.0000 mg | INTRAVENOUS | Status: AC
  Administered 2020-06-12: 04:00:00 1000 mg via INTRAVENOUS

## 2020-06-12 MED ORDER — PRENATAL MULTIVITAMIN CH
1.0000 | ORAL_TABLET | Freq: Every day | ORAL | Status: DC
  Administered 2020-06-12 – 2020-06-14 (×3): 1 via ORAL
  Filled 2020-06-12 (×3): qty 1

## 2020-06-12 MED ORDER — PANTOPRAZOLE SODIUM 40 MG PO TBEC
40.0000 mg | DELAYED_RELEASE_TABLET | Freq: Every day | ORAL | Status: DC
  Administered 2020-06-13 – 2020-06-14 (×2): 40 mg via ORAL
  Filled 2020-06-12 (×3): qty 1

## 2020-06-12 MED ORDER — NIFEDIPINE ER OSMOTIC RELEASE 30 MG PO TB24: 60.0000 mg | ORAL_TABLET | Freq: Every day | ORAL | Status: DC

## 2020-06-12 MED ORDER — ZOLPIDEM TARTRATE 5 MG PO TABS: 5.0000 mg | ORAL_TABLET | Freq: Every evening | ORAL | Status: DC | PRN

## 2020-06-12 MED ORDER — MISOPROSTOL 200 MCG PO TABS
ORAL_TABLET | ORAL | Status: AC
  Administered 2020-06-12: 04:00:00 1000 ug via RECTAL
  Filled 2020-06-12: qty 5

## 2020-06-12 MED ORDER — METHYLERGONOVINE MALEATE 0.2 MG/ML IJ SOLN: 0.2000 mg | INTRAMUSCULAR | Status: DC | PRN

## 2020-06-12 MED ORDER — ONDANSETRON HCL 4 MG PO TABS: 4.0000 mg | ORAL_TABLET | ORAL | Status: DC | PRN

## 2020-06-12 MED ORDER — CARBOPROST TROMETHAMINE 250 MCG/ML IM SOLN
INTRAMUSCULAR | Status: AC
  Filled 2020-06-12: qty 1

## 2020-06-12 MED ORDER — MEASLES, MUMPS & RUBELLA VAC IJ SOLR: 0.5000 mL | Freq: Once | INTRAMUSCULAR | Status: DC

## 2020-06-12 MED ORDER — TRANEXAMIC ACID-NACL 1000-0.7 MG/100ML-% IV SOLN
INTRAVENOUS | Status: AC
  Filled 2020-06-12: qty 100

## 2020-06-12 MED ORDER — LACTATED RINGERS IV BOLUS: 1000.0000 mL | Freq: Once | INTRAVENOUS | Status: DC

## 2020-06-12 MED ORDER — WITCH HAZEL-GLYCERIN EX PADS: 1.0000 "application " | MEDICATED_PAD | CUTANEOUS | Status: DC | PRN

## 2020-06-12 MED ORDER — METHYLERGONOVINE MALEATE 0.2 MG PO TABS: 0.2000 mg | ORAL_TABLET | ORAL | Status: DC | PRN

## 2020-06-12 MED ORDER — DIPHENOXYLATE-ATROPINE 2.5-0.025 MG PO TABS
2.0000 | ORAL_TABLET | Freq: Once | ORAL | Status: AC
  Administered 2020-06-12: 05:00:00 2 via ORAL
  Filled 2020-06-12: qty 2

## 2020-06-12 MED ORDER — CARBOPROST TROMETHAMINE 250 MCG/ML IM SOLN
250.0000 ug | Freq: Once | INTRAMUSCULAR | Status: AC
  Administered 2020-06-12: 04:00:00 250 ug via INTRAMUSCULAR

## 2020-06-12 MED ORDER — SODIUM CHLORIDE 0.9% FLUSH: 3.0000 mL | INTRAVENOUS | Status: DC | PRN

## 2020-06-12 MED ORDER — ONDANSETRON HCL 4 MG/2ML IJ SOLN: 4.0000 mg | INTRAMUSCULAR | Status: DC | PRN

## 2020-06-12 NOTE — Lactation Note (Addendum)
This note was copied from a baby's chart. Lactation Consultation Note  Patient Name: Katelyn Bonilla [id number] Date: 06/12/2020 Reason for consult: Initial assessment;Primapara;1st time breastfeeding;Late-preterm 34-36.6wks  LC in to visit with P1 Mom of 5 hr old LPTI.  Baby is [redacted]w[redacted]d, Mom on MgSO4 and had PPH (1238 ml).GDM on Metformin, GHTN on Labetalol.  Mom had baby STS until just recently as Mom is exhausted and dizzy.   Baby has had  2 supplements of 22 cal formula.  Watched FOB feeding baby 2nd bottle and assisted him to use paced bottle feeding.  Baby bottle feeding well without any apparent difficulty.  Reviewed breast massage and hand expression, glistening of colostrum noted on nipple.   Set up DEBP at bedside and sized the flanges.  Mom will let her nurse know when she is feeling up to starting to pump.  Mom made aware that early pumping and hand expression can lead to better milk supply.  FOB very involved and receptive to teaching.   Instructed on importance of disassembling pump parts, washing, rinsing and air drying in separate bin.  LPTI guidelines provided and reviewed briefly. Mom has a DEBP at home [facility])    Plan- 1- Keep baby STS as much as possible 2- Offer breast with cues, ask for help prn 3- Supplement per LPTI guidelines  EBM+/formula 4-Pump and breast massage/hand express to collect colostrum for baby.  Lactation brochure provided and parents aware of lactation support available as IP and OP.   Interventions Interventions: Breast feeding basics reviewed;Skin to skin;Breast massage;Hand express;DEBP  Lactation Tools Discussed/Used WIC Program: No Pump Review: Setup, frequency, and cleaning;Milk Storage Initiated by:: Erby Pian RN IBCLC Date initiated:: 06/12/20   Consult Status Consult Status: Follow-up Date: 06/13/20 Follow-up type: In-patient    Katelyn Bonilla 06/12/2020, 8:46 AM

## 2020-06-12 NOTE — Progress Notes (Signed)
CRITICAL VALUE ALERT  Critical Value:  Mag 7.4  Date & Time Notied:  06/12/2020 1936  Provider Notified: Dr. Claiborne Billings  Orders Received/Actions taken: Received verbal order to decrease magnesium rate to 1g/hr.

## 2020-06-12 NOTE — Progress Notes (Signed)
Pt called out c/o chest pressure, weakness, and hot flashes. BP at 1801 was 94/47. Re-checked BP at 1803 and was 112/53. O2 sat 97%. Notified Dr. Claiborne Billings. Received verbal order for an EKG and a STAT mag level. Per Dr. Claiborne Billings, keep magnesium going for now until the mag level comes back. Will continue to monitor.

## 2020-06-12 NOTE — Progress Notes (Addendum)
Patient is eating, ambulating, voiding.  Pain control is good.  Appropriate lochia, no complaints.  Vitals:   06/12/20 1051 06/12/20 1100 06/12/20 1205 06/12/20 1300  BP: 131/69  135/70   Pulse: 67  65   Resp:  19 18 18   Temp:   97.7 F (36.5 C)   TempSrc:   Oral   SpO2:   97%   Weight:      Height:        Fundus firm, abd nontender No bleeding noted on pad, bakri in place Ext: no calf tenderness  Lab Results  Component Value Date   WBC 25.2 (H) 06/12/2020   HGB 9.8 (L) 06/12/2020   HCT 27.8 (L) 06/12/2020   MCV 85.8 06/12/2020   PLT 323 06/12/2020    --/--/O NEG (12/16 1620)/  A/P Post partum day 0 s/p Vacuum extraction vaginal delivery. PPH of approx 1200cc, s/p medical mgmt and bakri balloon placed.  Per MH note 350cc insufflation.  Will plan to remove 50cc q30 starting approx 24 hrs after delivery to allow slow contraction. Am Hb 9.8, vital signs in normal range, no futher severe range BP's, Procardia discontinued and parameters for holding Labetalol 400mg  bid for low BPs discussed with RN Awaiting baby's Rh status to determine if mom needs Rhogam.  Addendum: Baby Rh negative, no rhogam needed.  Routine care.    06-06-1995

## 2020-06-12 NOTE — Anesthesia Postprocedure Evaluation (Signed)
Anesthesia Post Note  Patient: Katelyn Bonilla  Procedure(s) Performed: AN AD HOC LABOR EPIDURAL     Patient location during evaluation: Mother Baby Anesthesia Type: Epidural Level of consciousness: awake and alert Pain management: pain level controlled Vital Signs Assessment: post-procedure vital signs reviewed and stable Respiratory status: spontaneous breathing, nonlabored ventilation and respiratory function stable Cardiovascular status: stable Postop Assessment: no headache, no backache and epidural receding Anesthetic complications: no   No complications documented.  Last Vitals:  Vitals:   06/12/20 1300 06/12/20 1400  BP:    Pulse:    Resp: 18 18  Temp:    SpO2:      Last Pain:  Vitals:   06/12/20 1410  TempSrc:   PainSc: 8    Pain Goal: Patients Stated Pain Goal: 3 (06/12/20 1410)                 Carisha Kantor

## 2020-06-12 NOTE — Progress Notes (Signed)
Alerted by RN that pt reported increased cramping and bleeding.  Requested RN weigh pad, which showed approx 171cc, no prior bleeding noted since bakri placement.  I arrive to pt's room approx 20 min later, she reported feeling well but having cramps.  I examined pad and not further bleeding noted. Reviewed meds received at time of PPH, TXA x 1, hemabate x 1 and misoprostol rectally. Discussed what would be normal lochia with patient, RN will alert me if bleeding picks up again.

## 2020-06-13 LAB — CULTURE, BETA STREP (GROUP B ONLY)

## 2020-06-13 NOTE — Lactation Note (Signed)
This note was copied from a baby's chart. Lactation Consultation Note  Patient Name: Katelyn Bonilla Date: 06/13/2020 Reason for consult: Follow-up assessment;Primapara;1st time breastfeeding;Late-preterm 34-36.6wks (PPH) Type of Endocrine Disorder?: Diabetes (GDM) Baby 33hrs old, wt loss 3.31%. Baby asleep, skin to skin on mom's chest. Mom sitting in bed, reports attempted to BF with last 2 feedings, however baby only suckled ~34mins. Mom denies pumping today. Reports plans to BF upon discharge, however if baby unable to latch to breast would like to pump and offer EBM via bottle feeds.  Mom agreed to call [facility] for next feeding. Reinforced pumping with each feeding for stimulation, discussed length of feedings, assess for feeding cues/ signs of hunger. Mom voiced understanding and with no further concerns. BGilliam, RN, IBCLC      Interventions Interventions: DEBP  Lactation Tools Discussed/Used     Consult Status Consult Status: Follow-up Date: 06/13/20 Follow-up type: In-patient    Katelyn Bonilla 06/13/2020, 1:00 PM

## 2020-06-13 NOTE — Lactation Note (Signed)
This note was copied from a baby's chart. Lactation Consultation Note  Patient Name: Katelyn Bonilla Date: 06/13/2020 Reason for consult: Follow-up assessment;Primapara;1st time breastfeeding;Late-preterm 34-36.6wks Type of Endocrine Disorder?: Diabetes Baby 35hrs old, wt loss 3.31%, mom sitting in bed, baby asleep in bassinet. LC changed a moderate transitional stool and urine diaper. LC assisted mom with pumping, obtained ~82mls colostrum, LC spoon fed to baby. Assisted with latching to right breast cross cradle and football hold, baby latches to breast but does not suck. 23mm nipple shield applied, baby latched and with intermittent sucks ~75mins then mom supplemented with 59ml formula.  Reinforced pump frequency, washing of pump parts, feed when cues are noted, keep feedings to total. Discussed benefit of nipple shield, advised this is a temporary tool. Suggested use of a SNS, mom declined. Encouraged to call as needed for latch assistance. Will follow up tomorrow. Mom voiced understanding and with no further concerns. Left the room with mom resting in bed, maternal aunt at bedside holding sleeping baby. BGilliam, RN, IBCLC  Maternal Data    Feeding Feeding Type: Breast Fed  LATCH Score Latch: Grasps breast easily, tongue down, lips flanged, rhythmical sucking.  Audible Swallowing: A few with stimulation  Type of Nipple: Everted at rest and after stimulation  Comfort (Breast/Nipple): Soft / non-tender  Hold (Positioning): Assistance needed to correctly position infant at breast and maintain latch.  LATCH Score: 8  Interventions Interventions: Breast feeding basics reviewed;Assisted with latch;Skin to skin;Breast massage;Hand express;Breast compression;Adjust position;Support pillows;Position options;Expressed milk;DEBP  Lactation Tools Discussed/Used Tools: Nipple Shields Nipple shield size: 24 Flange Size: 24 Breast pump type: Double-Electric Breast  Pump   Consult Status Consult Status: Follow-up Date: 06/14/20 Follow-up type: In-patient    Katelyn Bonilla 06/13/2020, 2:42 PM

## 2020-06-13 NOTE — Progress Notes (Signed)
Vaginal sponge scan- All Clear- [id number]

## 2020-06-13 NOTE — Progress Notes (Signed)
Patient is eating, ambulating, voiding.  Pain control is good.  She reported some mild chest pressure yesterday, Mag level was checked and within range at 7.4, reduced to rate of 1g/hr and then discontinued after 24hrs.  Pt denies any chest symptoms today.  Overnight Bakri was slowly emptied and removed this morning.  RN called to alert that bleeding was scant, and pt up to bathroom with some mild dizziness, orthostatics normal.  She denies any complaints currently.  Vitals:   06/13/20 0758 06/13/20 0805 06/13/20 0807 06/13/20 1020  BP: 111/79 125/80 122/63 (!) 133/57  Pulse: 82 85 84 85  Resp: 18     Temp: 98.1 F (36.7 C)     TempSrc: Oral     SpO2: 97% 99%    Weight:      Height:        Fundus firm Ext: no calf tenderness  Lab Results  Component Value Date   WBC 25.2 (H) 06/12/2020   HGB 9.8 (L) 06/12/2020   HCT 27.8 (L) 06/12/2020   MCV 85.8 06/12/2020   PLT 323 06/12/2020    --/--/O NEG (12/16 1620)  A/P Post partum day 1 s/p vacuum delivery with PPH S/p medical mgmt and bakri for [facility], now with scant bleeding and Hb 9.8 on iron bid BPs in normal range, on labetalol 400bid, will continue to monitor closely.  Advised RN to call if trending into mild range. S/p MgSO4  Routine care.  Expect d/c 12/19.    Katelyn Bonilla

## 2020-06-14 LAB — PROTEIN / CREATININE RATIO, URINE
Creatinine, Urine: 244.89 mg/dL
Total Protein, Urine: >3000 mg/dL

## 2020-06-14 MED ORDER — LABETALOL HCL 200 MG PO TABS: 400.0000 mg | ORAL_TABLET | Freq: Two times a day (BID) | ORAL | 0 refills | Status: DC

## 2020-06-14 NOTE — Discharge Summary (Signed)
Postpartum Discharge Summary     Patient Name: Katelyn Bonilla DOB: 07/17/1990 MRN: [id number]  Date of admission: 06/11/2020 Delivery date:06/12/2020  Delivering provider: Bobbye Charleston  Date of discharge: 06/14/2020  Admitting diagnosis: Preterm premature rupture of membranes (PPROM) with unknown onset of labor [O42.919] Intrauterine pregnancy: [redacted]w[redacted]d    Secondary diagnosis:  Active Problems:   Preterm premature rupture of membranes (PPROM) with unknown onset of labor  Additional problems: GHTN, GDM, obesity    Discharge diagnosis: Preterm Pregnancy Delivered, Gestational Hypertension, GDM A2 and PPH                                              Post partum procedures:bakri balloon placement with administration of medications for PPH Augmentation: Pitocin Complications: [id number] Hospital course: Induction of Labor With Vaginal Delivery   29y.o. yo G1P0101 at 363w1das admitted to the hospital 06/11/2020 for induction of labor.  Indication for induction: PROM.  Patient had an uncomplicated labor course as follows: Membrane Rupture Time/Date: 2:30 PM ,06/11/2020   Delivery Method:Vaginal, Vacuum (Extractor)  Episiotomy: None  Lacerations:  1st degree;Labial  Details of delivery can be found in separate delivery note.  Patient had a routine postpartum course. Patient is discharged home 06/14/20.  Newborn Data: Birth date:06/12/2020  Birth time:3:34 AM  Gender:Female  Living status:Living  Apgars:8 ,9  Weight:2870 g   Magnesium Sulfate received: No BMZ received: No Rhophylac:No MMR:N/A T-DaP:see office note Flu: No Transfusion:No  Physical exam  Vitals:   06/13/20 2212 06/14/20 0508 06/14/20 0755 06/14/20 0756  BP: (!) 157/78 (!) 141/81  (!) 143/75  Pulse: 97 89  89  Resp: '19 18  18  ' Temp:  98.2 F (36.8 C)  98.4 F (36.9 C)  TempSrc:  Oral  Oral  SpO2:   97% 99%  Weight:      Height:       General: alert, cooperative and no  distress Lochia: appropriate Uterine Fundus: firm DVT Evaluation: No evidence of DVT seen on physical exam. Labs: Lab Results  Component Value Date   WBC 25.2 (H) 06/12/2020   HGB 9.8 (L) 06/12/2020   HCT 27.8 (L) 06/12/2020   MCV 85.8 06/12/2020   PLT 323 06/12/2020   CMP Latest Ref Rng & Units 06/11/2020  Glucose 70 - 99 mg/dL 135(H)  BUN 6 - 20 mg/dL 18  Creatinine 0.44 - 1.00 mg/dL 0.82  Sodium 135 - 145 mmol/L 134(L)  Potassium 3.5 - 5.1 mmol/L 4.0  Chloride 98 - 111 mmol/L 103  CO2 22 - 32 mmol/L 15(L)  Calcium 8.9 - 10.3 mg/dL 8.8(L)  Total Protein 6.5 - 8.1 g/dL 5.2(L)  Total Bilirubin 0.3 - 1.2 mg/dL 0.4  Alkaline Phos 38 - 126 U/L 147(H)  AST 15 - 41 U/L 25  ALT 0 - 44 U/L 24   Edinburgh Score: Edinburgh Postnatal Depression Scale Screening Tool 06/14/2020  I have been able to laugh and see the funny side of things. 0  I have looked forward with enjoyment to things. 0  I have blamed myself unnecessarily when things went wrong. 0  I have been anxious or worried for no good reason. 0  I have felt scared or panicky for no good reason. 0  Things have been getting on top of me. 0  I have been so unhappy that I have  had difficulty sleeping. 0  I have felt sad or miserable. 0  I have been so unhappy that I have been crying. 0  The thought of harming myself has occurred to me. 0  Edinburgh Postnatal Depression Scale Total 0      After visit meds:  Allergies as of 06/14/2020   No Known Allergies     Medication List    STOP taking these medications   aspirin EC 81 MG tablet   metFORMIN 500 MG tablet Commonly known as: GLUCOPHAGE   NIFEdipine 60 MG 24 hr tablet Commonly known as: ADALAT CC     TAKE these medications   labetalol 200 MG tablet Commonly known as: NORMODYNE Take 2 tablets (400 mg total) by mouth 2 (two) times daily.   omeprazole 20 MG capsule Commonly known as: PRILOSEC Take 20 mg by mouth daily.   prenatal multivitamin Tabs  tablet Take 1 tablet by mouth daily at 12 noon.   Vitamin D3 1.25 MG (50000 UT) Caps Take by mouth.        Discharge home in stable condition Infant Feeding: Breast Infant Disposition:home with mother Discharge instruction: per After Visit Summary and Postpartum booklet. Activity: Advance as tolerated. Pelvic rest for 6 weeks.  Diet: routine diet Anticipated Birth Control: Unsure Postpartum Appointment:4 weeks and within 5 days for BP check Additional Postpartum F/U: BP check see above Future Appointments: Future Appointments  Date Time Provider [place]  06/16/2020  8:00 AM [facility] NURSE [facility]   Follow up Visit:  Follow-up Information    Bobbye Charleston, MD Follow up in 4 week(s).   Specialty: Obstetrics and Gynecology Why: make postpartum visit for 4 weeks, please also schedule BP checkup within 5 days of discharge Contact information: [address] [place] [address] [phone]                   06/14/2020 Allyn Kenner, DO

## 2020-06-14 NOTE — Lactation Note (Signed)
This note was copied from a baby's chart. Lactation Consultation Note  Patient Name: Katelyn Bonilla JDBZM'C Date: 06/14/2020 Reason for consult: Follow-up assessment;Late-preterm 34-36.6wks  LC to room for f/u visit. Mom is unsure when she and baby will d/c. Mom and baby "practiced" bf yesterday and she pumped only one time. Encouraged increasing bf practice and pumping to 8xday. Reviewed feeding expectations of preterm infants and encouraged continuance of current feeding poc. D/c ed provided. Baby was sleeping soundly; offered to return to assist with bf prn. Patient was provided with the opportunity to ask questions. All concerns were addressed.    Consult Status Consult Status: Follow-up Follow-up type: In-patient   Elder Negus, MA IBCLC 06/14/2020, 9:54 AM

## 2020-06-14 NOTE — Plan of Care (Signed)
Patient to be discharged home with printed instructions. Stasia Somero L Lequan Dobratz, RN  

## 2020-06-14 NOTE — Progress Notes (Signed)
Patient is eating, ambulating, voiding.  Pain control is good.  Denies CP/SOB, HA, vision change or RUQ pain.  Reports minimal bleeding.  No other complaints.  Vitals:   06/13/20 2212 06/14/20 0508 06/14/20 0755 06/14/20 0756  BP: (!) 157/78 (!) 141/81  (!) 143/75  Pulse: 97 89  89  Resp: 19 18  18   Temp:  98.2 F (36.8 C)  98.4 F (36.9 C)  TempSrc:  Oral  Oral  SpO2:   97% 99%  Weight:      Height:        Fundus firm Ext: +1 pedal edema, no calf tenderness  Lab Results  Component Value Date   WBC 25.2 (H) 06/12/2020   HGB 9.8 (L) 06/12/2020   HCT 27.8 (L) 06/12/2020   MCV 85.8 06/12/2020   PLT 323 06/12/2020    --/--/O NEG (12/16 1620)  A/P Post partum day 2. One overnight severe range approx time of labetalol administration.  Repeats mild range.  Advised pt to call office to f/u for BP check this week and reviewed PEC precautions with her. Cont. Labetalol 400mg  bid  Routine care.  Expect d/c today.    06-06-1995

## 2020-06-15 ENCOUNTER — Ambulatory Visit: Payer: Self-pay

## 2020-06-15 LAB — TYPE AND SCREEN
ABO/RH(D): O NEG
Antibody Screen: NEGATIVE
Unit division: 0
Unit division: 0

## 2020-06-15 LAB — BPAM RBC
Blood Product Expiration Date: 202201142359
Blood Product Expiration Date: 202201142359
Unit Type and Rh: 9500
Unit Type and Rh: 9500

## 2020-06-15 NOTE — Lactation Note (Signed)
This note was copied from a baby's chart. Lactation Consultation Note  Patient Name: Katelyn Bonilla WUJWJ'X Date: 06/15/2020 Reason for consult: Follow-up assessment;Hyperbilirubinemia;Primapara;1st time breastfeeding;Late-preterm 34-36.6wks  LC in to visit with P1 Mom of LPTI at 92 hrs old.  Baby is at 2.4% weight loss and continues on double phototherapy.  Bilirubin this am has dropped to 11.6.  Baby's output is WNL and stools have transitioned to yellow.  Baby is currently being bottle fed 22 cal formula with volumes of 30-40 ml.  Parents know to awaken baby well at least every 3 hrs, feeding sooner if she is cueing.    Mom currently double pumping on maintenance setting.  Mom expressing 30 ml from right breast and less on left.  Encouraged Mom to continue to be consistently pumping to support her milk supply.  Engorgement prevention and treatment reviewed.    Mom desires OP lactation follow-up.  Message sent.  Plan- 1- Keep baby STS as much as possible (once off phototherapy) 2- Offer breast with cues, but awaken baby prn to asure >8 feedings per 24 hrs. 3- supplement baby with EBM+/22 cal formula by paced bottle 30+ ml  4- Pump both breasts 15-30 mins, adding breast massage and hand expression  Mom aware of OP lactation support available.      Interventions Interventions: Breast feeding basics reviewed;Skin to skin;Breast massage;Hand express;DEBP  Lactation Tools Discussed/Used Tools: Pump;Flanges;Bottle Flange Size: 24 Breast pump type: Double-Electric Breast Pump   Consult Status Consult Status: Complete Date: 06/15/20 Follow-up type: Out-patient    Katelyn Bonilla 06/15/2020, 9:26 AM

## 2020-06-16 ENCOUNTER — Ambulatory Visit: Payer: Managed Care, Other (non HMO)

## 2020-06-24 ENCOUNTER — Ambulatory Visit: Payer: Managed Care, Other (non HMO)

## 2020-06-25 ENCOUNTER — Inpatient Hospital Stay (HOSPITAL_COMMUNITY)
Admission: AD | Admit: 2020-06-25 | Payer: Managed Care, Other (non HMO) | Source: Home / Self Care | Admitting: Obstetrics & Gynecology

## 2020-06-25 ENCOUNTER — Inpatient Hospital Stay (HOSPITAL_COMMUNITY): Payer: Managed Care, Other (non HMO)

## 2020-07-01 ENCOUNTER — Telehealth (HOSPITAL_COMMUNITY): Payer: Self-pay

## 2020-07-01 NOTE — Telephone Encounter (Signed)
Created in error

## 2022-04-12 IMAGING — US US MFM OB DETAIL+14 WK
1 series · 13 of 28 positions shown · non-contrast
Comparison: none

[Series 1: us mfm ob detail+14 wk · 13 of 63 slices shown]
[im 3/63]
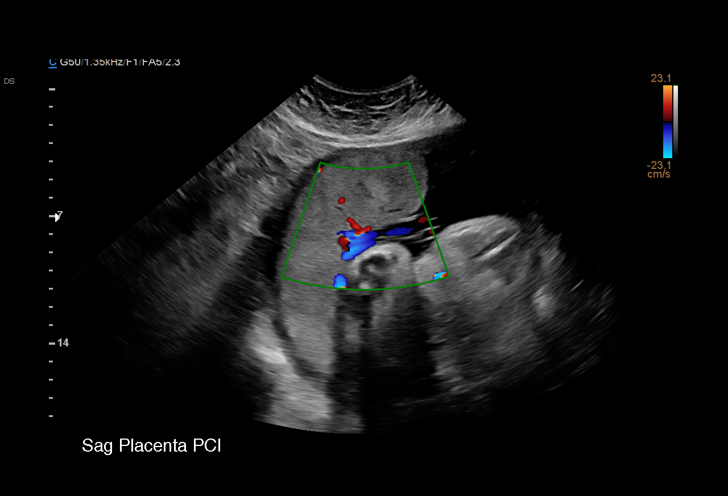
[im 7/63]
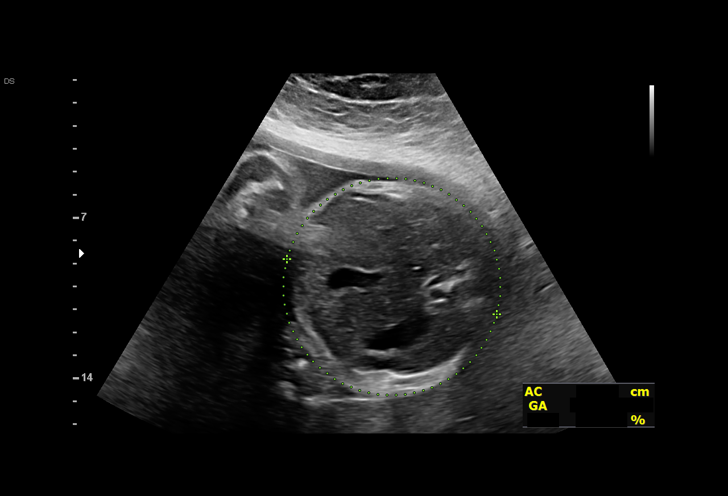
[im 12/63]
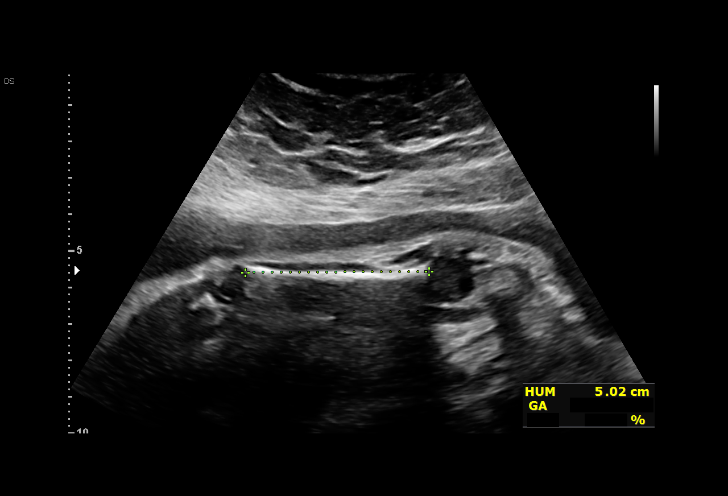
[im 17/63]
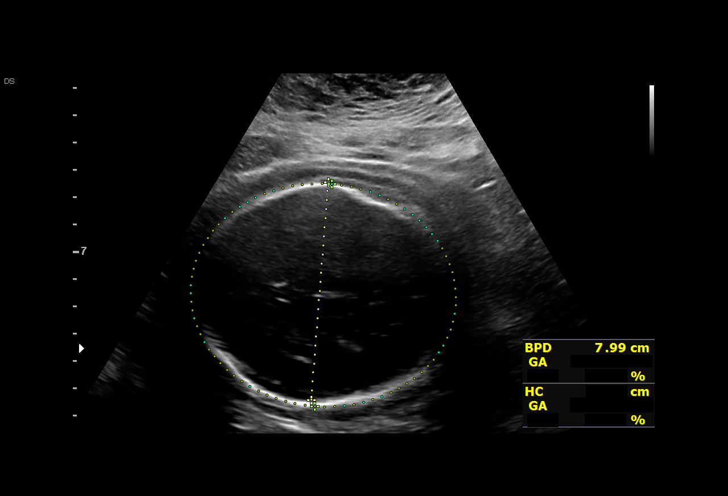
[im 21/63]
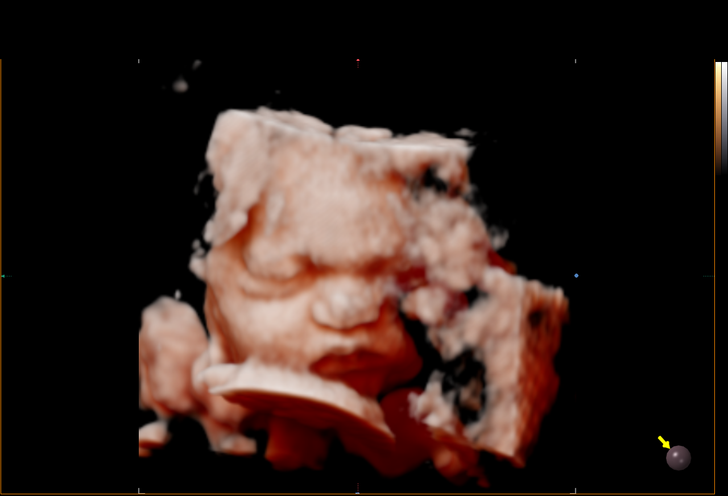
[im 26/63]
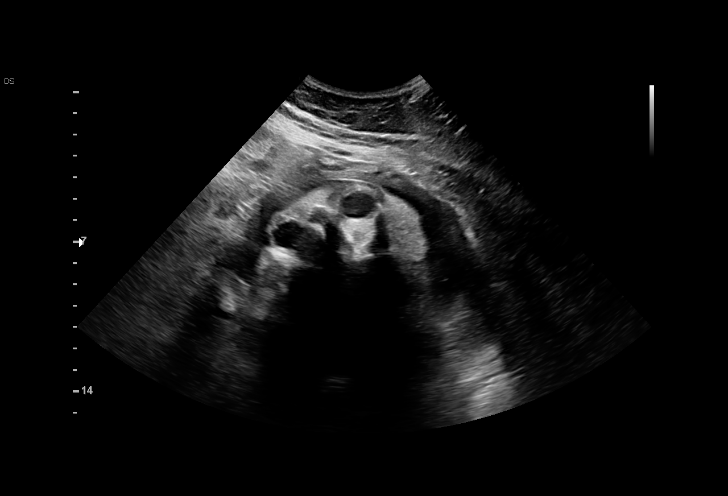
[im 33/63]
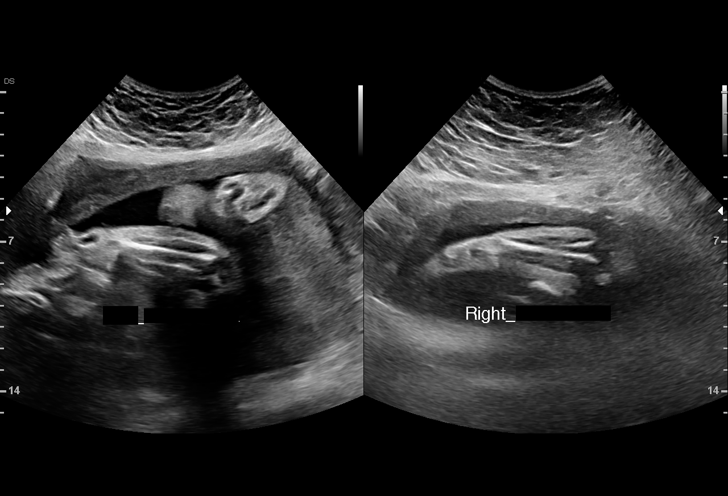
[im 37/63]
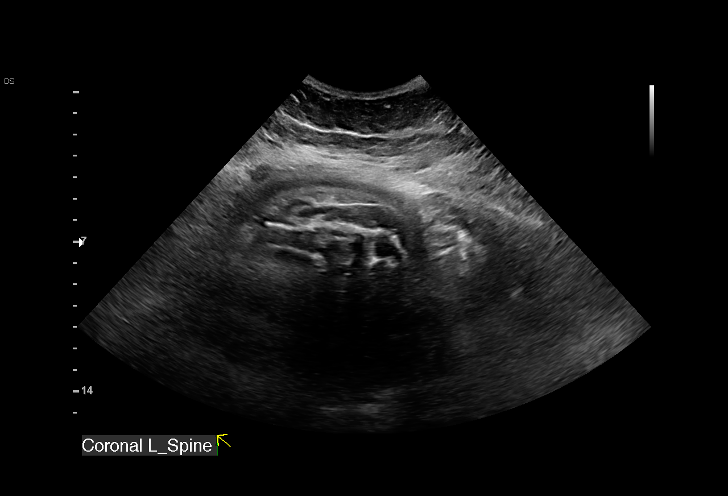
[im 42/63]
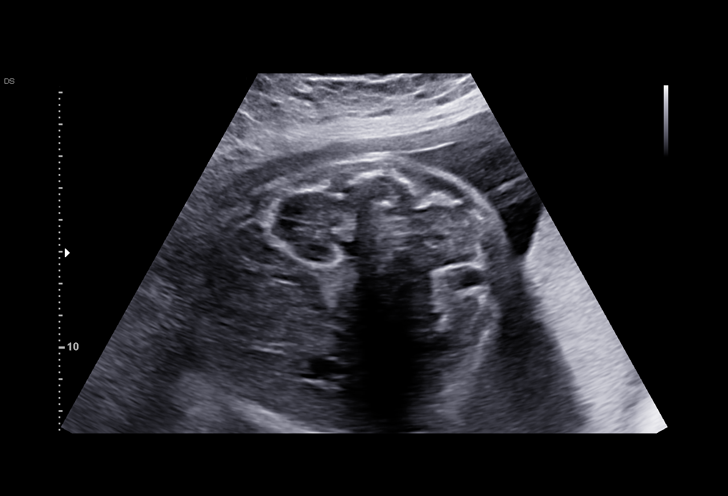
[im 46/63]
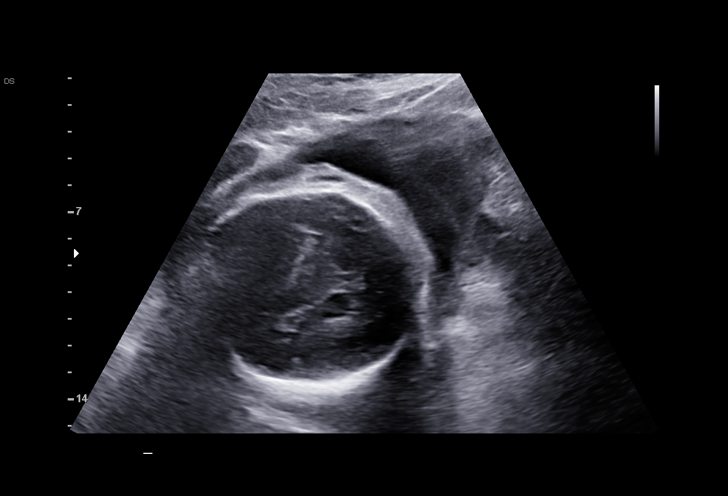
[im 51/63]
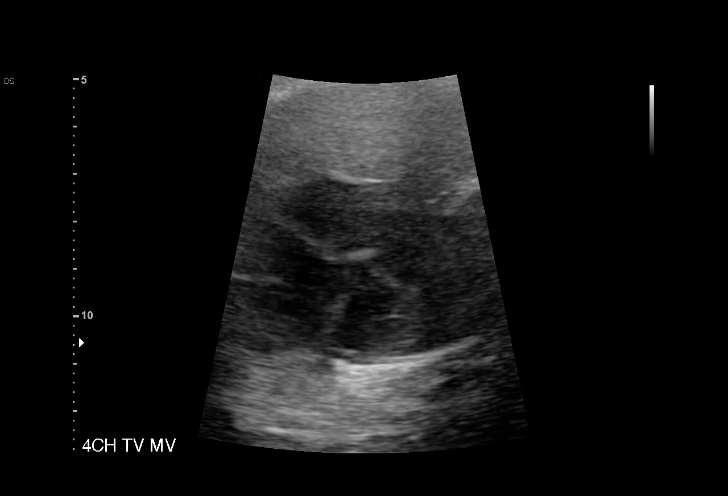
[im 56/63]
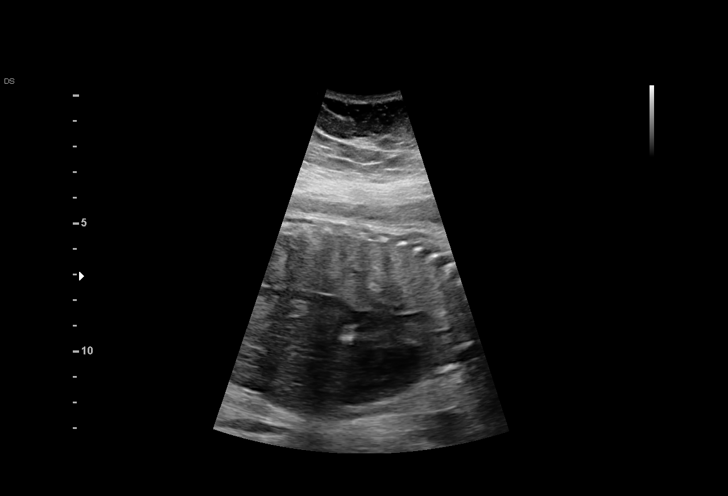
[im 60/63]
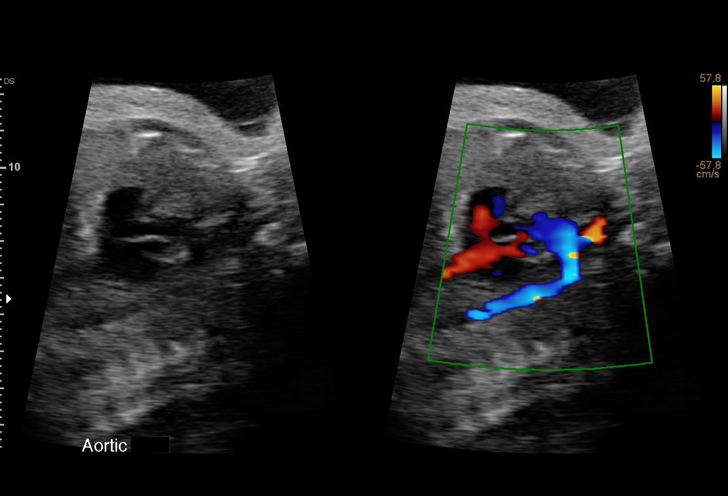

[13 of 28 positions shown; findings below may reference images not displayed]

OBGYN

Indications

 Gestational diabetes in pregnancy, insulin
 controlled
 Obesity complicating pregnancy, third
 trimester (Pre-G BMI 41)
 Encounter for antenatal screening for
 malformations
 Rh negative state in antepartum
 Abn PADRIK (T21 [DATE]), Mat 21 - neg, AFP -
 neg
 29 weeks gestation of pregnancy
Fetal Evaluation

 Num Of Fetuses:         1
 Cardiac Activity:       Observed
 Presentation:           Cephalic
 Placenta:               Right Posterior Fundal
 P. Cord Insertion:      Visualized, central

 Amniotic Fluid
 AFI FV:      Within normal limits

 AFI Sum(cm)     %Tile       Largest Pocket(cm)
 13.72           44
 RUQ(cm)       RLQ(cm)       LUQ(cm)        LLQ(cm)
 0
Biometry

 BPD:      80.7  mm     G. Age:  32w 3d         96  %    CI:        80.04   %    70 - 86
                                                         FL/HC:      19.7   %    19.2 -
 HC:       285   mm     G. Age:  31w 2d         57  %    HC/AC:      0.96        0.99 -
 AC:      296.5  mm     G. Age:  33w 4d       > 99  %    FL/BPD:     69.5   %    71 - 87
 FL:       56.1  mm     G. Age:  29w 4d         25  %    FL/AC:      18.9   %    20 - 24
 HUM:      50.6  mm     G. Age:  29w 5d         47  %
 CER:      35.9  mm     G. Age:  30w 0d         39  %

 LV:        5.5  mm

 Est. FW:    5262  gm      4 lb 3 oz     97  %
OB History

 Gravidity:    1
Gestational Age

 LMP:           29w 6d        Date:  10/10/19                 EDD:   07/16/20
 U/S Today:     31w 5d                                        EDD:   07/03/20
 Best:          29w 6d     Det. By:  LMP  (10/10/19)          EDD:   07/16/20
Anatomy

 Cranium:               Appears normal         LVOT:                   Appears normal
 Cavum:                 Appears normal         Aortic Arch:            Appears normal
 Ventricles:            Appears normal         Ductal Arch:            Not well visualized
 Choroid Plexus:        Appears normal         Diaphragm:              Appears normal
 Cerebellum:            Appears normal         Stomach:                Appears normal, left
                                                                       sided
 Posterior Fossa:       Appears normal         Abdomen:                Appears normal
 Nuchal Fold:           Not applicable (>20    Abdominal Wall:         Appears nml (cord
                        wks GA)                                        insert, abd wall)
 Face:                  Appears normal         Cord Vessels:           Appears normal (3
                        (orbits and profile)                           vessel cord)
 Lips:                  Appears normal         Kidneys:                Appear normal
 Palate:                Not well visualized    Bladder:                Appears normal
 Thoracic:              Appears normal         Spine:                  Appears normal, C-
                                                                       Sp suboptimal
 Heart:                 Appears normal         Upper Extremities:      Appears normal
                        (4CH, axis, and
                        situs)
 RVOT:                  Appears normal         Lower Extremities:      Appears normal

 Other:  Fetus appears to be female. Nasal bone visualized. Heels/feet and
         open hands/5th digits visualized. VC, 3VV and 3VTV visualized.
         Technicallly difficult due to advanced GA and maternal habitus.
Cervix Uterus Adnexa

 Cervix
 Not visualized (advanced GA >50wks)
 Right Ovary
 Within normal limits.

 Left Ovary
 Within normal limits.
Impression

 Single intrauterine pregnancy here for a detailed anatomy
 due to [id number]
 Normal anatomy with measurements consistent with dates
 There is good fetal movement and amniotic fluid volume
 Suboptimal views of the fetal anatomy were obtained
 secondary to fetal position and late gestation.

 She had an abnormal PADRIK with a low risk NIPS and neg AFP.

 She is taking Metformin 500 mg- she reports that her blood
 glucose with slightly elevated fasting 91-92 mg/dL

 Her blood pressure today was 134/68 mmHg, she has an
 occasional elevated blood pressure either 140 or 90 mmgh
 on two occassion but after 20 weeks gestation.
Recommendations

 Initiate testing at 32 weeks.
 Repeat growth in 3-4 weeks
 Delivery between 37-39 weeks pending maternal and fetal
 status.

## 2022-05-09 IMAGING — US US MFM FETAL BPP W/O NON-STRESS
1 series · 14 of 28 positions shown · non-contrast
Comparison: none

[Series 1: us mfm fetal bpp w/o non-stress · 48 acquisitions, 14 frames shown]
[im 2/48]
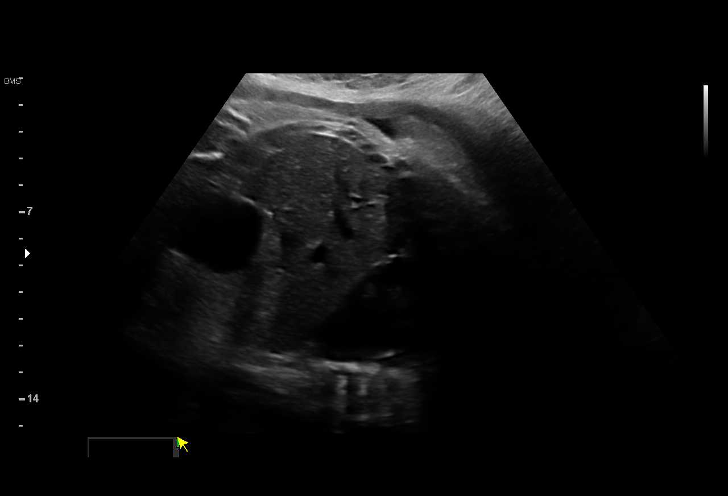
[im 6/48]
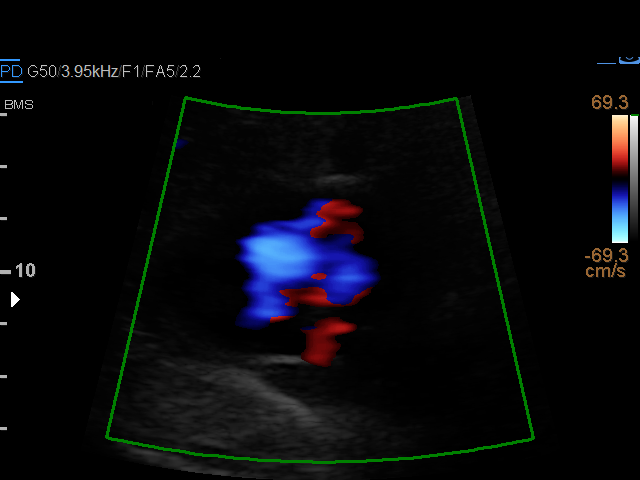
[im 9/48]
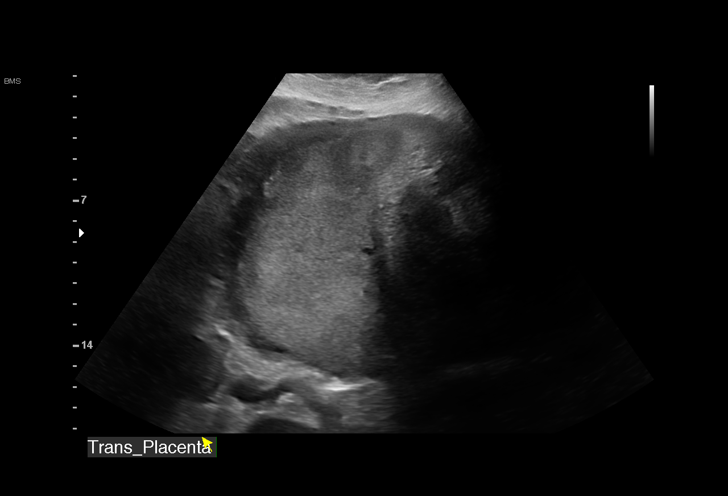
[im 13/48]
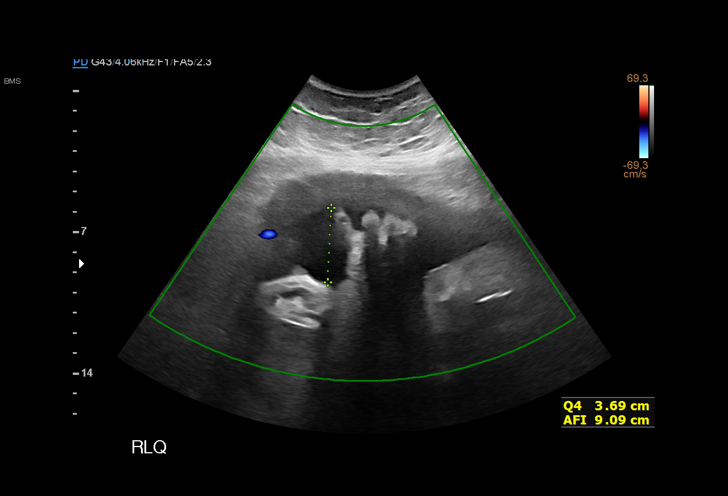
[im 16/48]
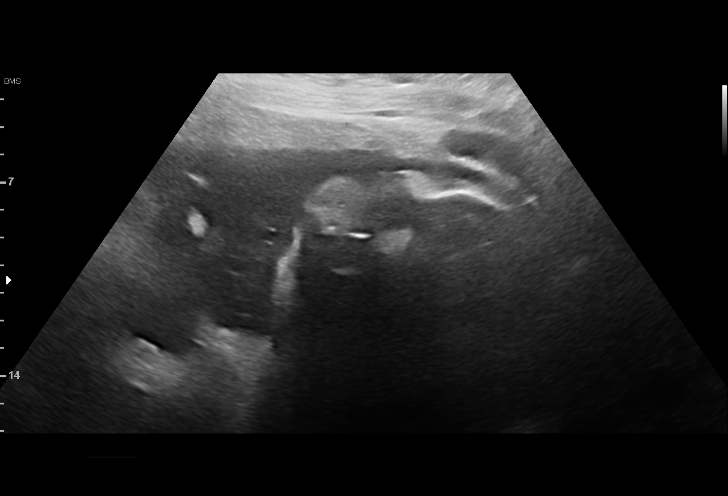
[im 20/48]
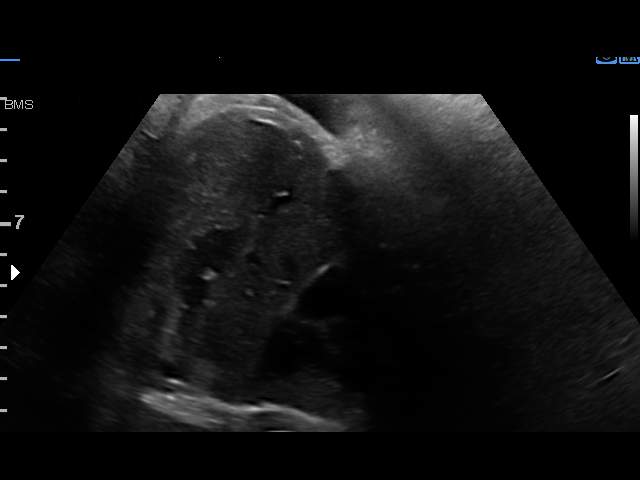
[im 23/48]
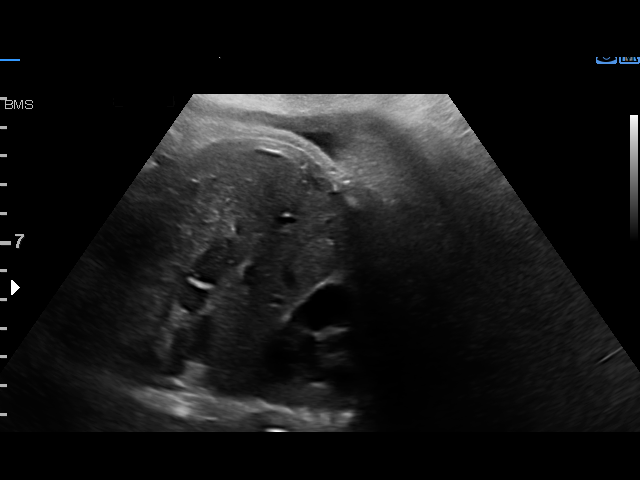
[im 27/48]
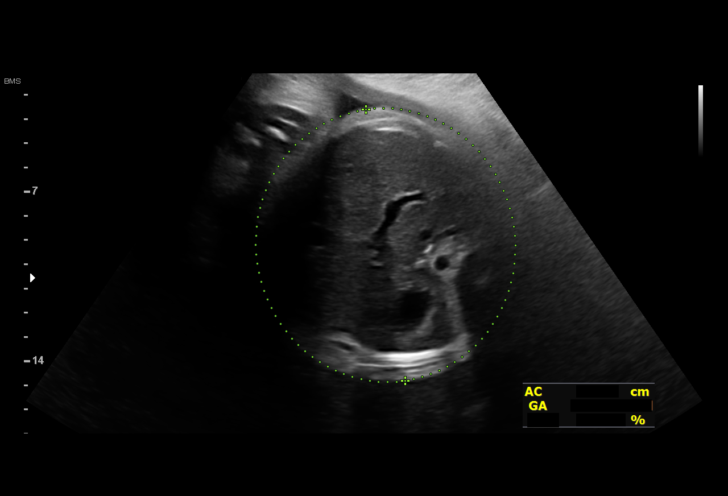
[im 30/48]
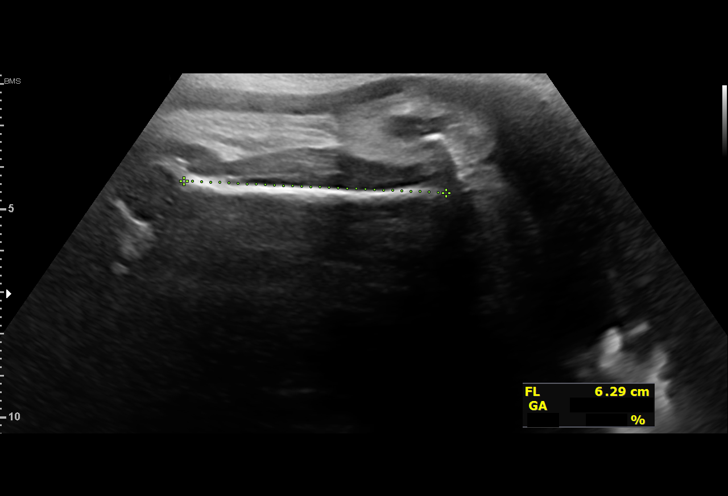
[im 34/48]
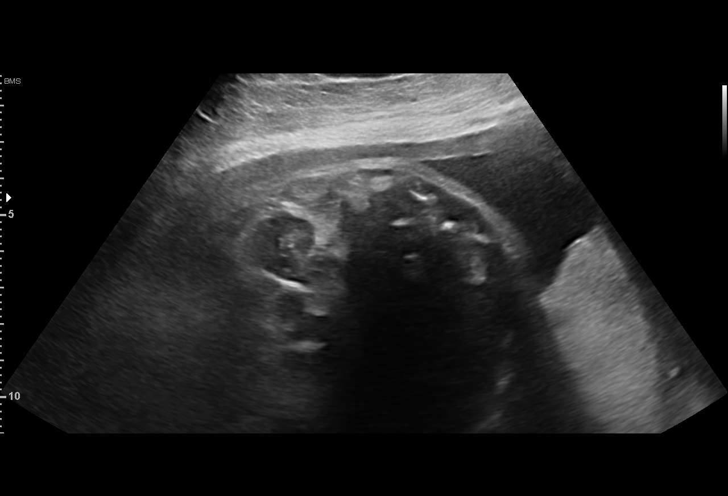
[im 37/48]
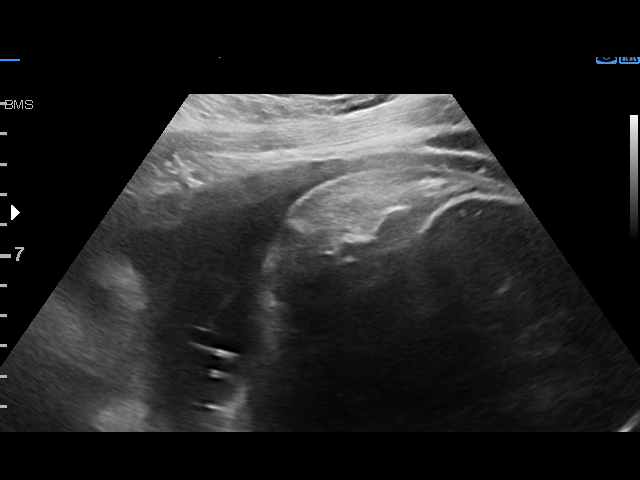
[im 41/48]
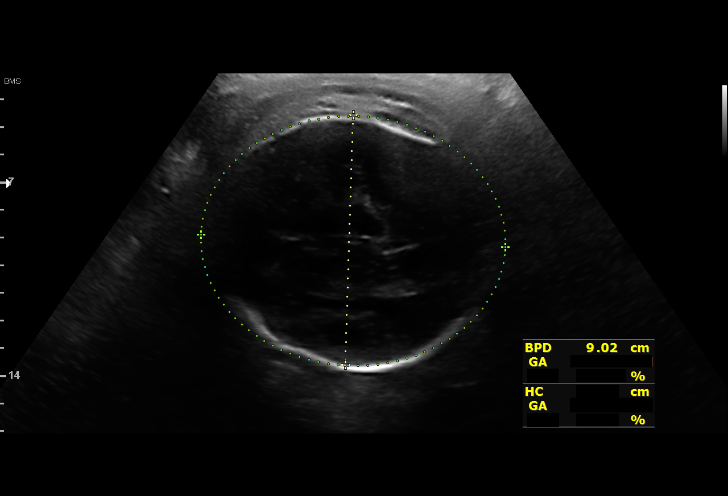
[im 44/48]
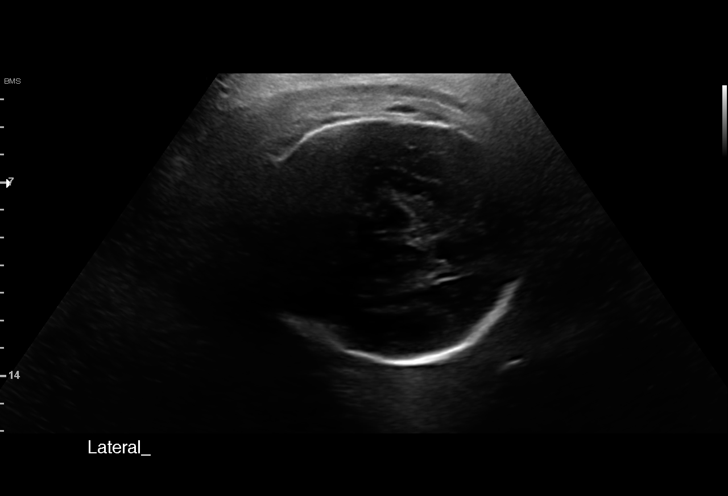
[im 48/48]
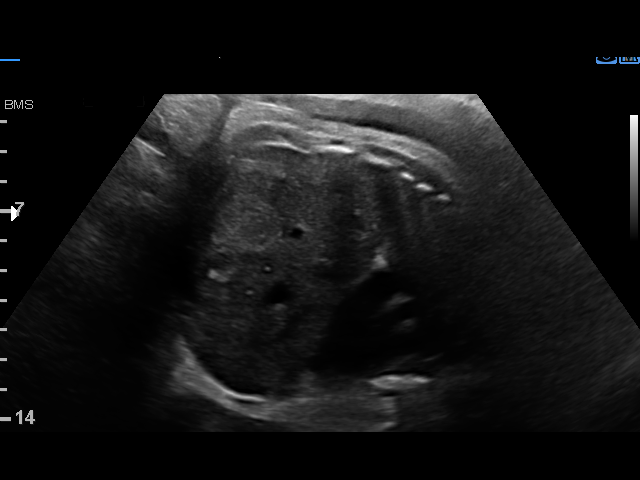

[14 of 28 positions shown; findings below may reference images not displayed]

OBGYN

                                                      MIIDO

Indications

 Gestational diabetes in pregnancy,
 controlled by oral hypoglycemic drugs
 Obesity complicating pregnancy, third
 trimester (Pre-G BMI 41)
 Rh negative state in antepartum
 Abn GB (T21 [DATE]), Mat 21 - neg, AFP -
 neg
 Hypertension - Gestational
 33 weeks gestation of pregnancy
Fetal Evaluation

 Num Of Fetuses:         1
 Fetal Heart Rate(bpm):  152
 Cardiac Activity:       Observed
 Presentation:           Cephalic
 Placenta:               Posterior

 AFI Sum(cm)     %Tile       Largest Pocket(cm)
 9.1             12
 RUQ(cm)       RLQ(cm)       LUQ(cm)        LLQ(cm)

Biophysical Evaluation

 Amniotic F.V:   Pocket => 2 cm             F. Tone:        Observed
 F. Movement:    Observed                   Score:          [DATE]
 F. Breathing:   Observed
Biometry

 BPD:        90  mm     G. Age:  36w 3d         98  %    CI:        81.39   %    70 - 86
                                                         FL/HC:      20.2   %    19.4 -
 HC:      314.9  mm     G. Age:  35w 2d         56  %    HC/AC:      0.91        0.96 -
 AC:      345.4  mm     G. Age:  38w 3d       > 99  %    FL/BPD:     70.8   %    71 - 87
 FL:       63.7  mm     G. Age:  32w 6d         20  %    FL/AC:      18.4   %    20 - 24

 Est. FW:    4052  gm      6 lb 9 oz     99  %
OB History

 Gravidity:    1
Gestational Age

 LMP:           33w 5d        Date:  10/10/19                 EDD:   07/16/20
 U/S Today:     35w 5d                                        EDD:   07/02/20
 Best:          33w 5d     Det. By:  LMP  (10/10/19)          EDD:   07/16/20
Anatomy

 Cranium:               Previously seen        LVOT:                   Previously seen
 Cavum:                 Previously seen        Aortic Arch:            Previously seen
 Ventricles:            Previously seen        Ductal Arch:            Not well visualized
 Choroid Plexus:        Previously seen        Diaphragm:              Appears normal
 Cerebellum:            Previously seen        Stomach:                Appears normal, left
                                                                       sided
 Posterior Fossa:       Previously seen        Abdomen:                Appears normal
 Nuchal Fold:           Not applicable (>20    Abdominal Wall:         Previously seen
                        wks GA)
 Face:                  Orbits and profile     Cord Vessels:           Appears normal (3
                        previously seen                                vessel cord)
 Lips:                  Previously seen        Kidneys:                Appear normal
 Palate:                Not well visualized    Bladder:                Appears normal
 Thoracic:              Previously seen        Spine:                  Prev vis, C-Sp
                                                                       suboptimal
 Heart:                 Previously seen        Upper Extremities:      Previously seen
 RVOT:                  Previously seen        Lower Extremities:      Previously seen

 Other:  Fetus appears to be female. Nasal bone prev visualized. Heels/feet
         and open hands/5th digits pr visualized. VC, 3VV and 3VTV pr
         visualized. Technicallly difficult due to advanced GA and maternal
         habit
Cervix Uterus Adnexa

 Cervix
 Not visualized (advanced GA >03wks)
Comments

 This patient was seen for a follow up growth scan due to
 gestational diabetes treated with Metformin and recently
 diagnosed gestational hypertension that is treated with
 nifedipine.  Her blood pressures in our office today were
 142/94 and 140/86.  She denies any symptoms of
 preeclampsia.
 She was informed that the fetal growth measures large for
 her gestational [AGE]th percentile).  There was normal
 amniotic fluid noted today.
 A biophysical profile performed today was [DATE].
 Due to gestational diabetes and gestational hypertension,
 she is already scheduled for delivery at around 37 weeks.
 Preeclampsia precautions were reviewed today.
 Another biophysical profile scheduled in 1 week.

## 2023-01-07 ENCOUNTER — Emergency Department (HOSPITAL_BASED_OUTPATIENT_CLINIC_OR_DEPARTMENT_OTHER): Payer: 59

## 2023-01-07 ENCOUNTER — Emergency Department (HOSPITAL_BASED_OUTPATIENT_CLINIC_OR_DEPARTMENT_OTHER)
Admission: EM | Admit: 2023-01-07 | Discharge: 2023-01-07 | Disposition: A | Discharge status: Home / Self Care | Payer: 59 | Attending: Emergency Medicine | Admitting: Emergency Medicine

## 2023-01-07 ENCOUNTER — Other Ambulatory Visit: Payer: Self-pay

## 2023-01-07 ENCOUNTER — Encounter (HOSPITAL_BASED_OUTPATIENT_CLINIC_OR_DEPARTMENT_OTHER): Payer: Self-pay | Admitting: Emergency Medicine

## 2023-01-07 DIAGNOSIS — O2391 Unspecified genitourinary tract infection in pregnancy, first trimester: Secondary | ICD-10-CM | POA: Insufficient documentation

## 2023-01-07 DIAGNOSIS — R739 Hyperglycemia, unspecified: Secondary | ICD-10-CM | POA: Diagnosis not present

## 2023-01-07 DIAGNOSIS — D72829 Elevated white blood cell count, unspecified: Secondary | ICD-10-CM | POA: Insufficient documentation

## 2023-01-07 DIAGNOSIS — Z3A01 Less than 8 weeks gestation of pregnancy: Secondary | ICD-10-CM | POA: Insufficient documentation

## 2023-01-07 DIAGNOSIS — O200 Threatened abortion: Secondary | ICD-10-CM

## 2023-01-07 DIAGNOSIS — O4690 Antepartum hemorrhage, unspecified, unspecified trimester: Secondary | ICD-10-CM

## 2023-01-07 DIAGNOSIS — O209 Hemorrhage in early pregnancy, unspecified: Secondary | ICD-10-CM | POA: Diagnosis present

## 2023-01-07 DIAGNOSIS — O208 Other hemorrhage in early pregnancy: Secondary | ICD-10-CM

## 2023-01-07 DIAGNOSIS — R8271 Bacteriuria: Secondary | ICD-10-CM

## 2023-01-07 LAB — URINALYSIS, W/ REFLEX TO CULTURE (INFECTION SUSPECTED)
Bilirubin Urine: NEGATIVE
Glucose, UA: NEGATIVE mg/dL
Ketones, ur: NEGATIVE mg/dL
Leukocytes,Ua: NEGATIVE
Nitrite: NEGATIVE
Protein, ur: NEGATIVE mg/dL
Specific Gravity, Urine: 1.015 (ref 1.005–1.030)
pH: 6 (ref 5.0–8.0)

## 2023-01-07 LAB — CBC WITH DIFFERENTIAL/PLATELET
Abs Immature Granulocytes: 0.04 10*3/uL (ref 0.00–0.07)
Basophils Absolute: 0 10*3/uL (ref 0.0–0.1)
Basophils Relative: 0 %
Eosinophils Absolute: 0.3 10*3/uL (ref 0.0–0.5)
Eosinophils Relative: 2 %
HCT: 38.9 % (ref 36.0–46.0)
Hemoglobin: 13.2 g/dL (ref 12.0–15.0)
Immature Granulocytes: 0 %
Lymphocytes Relative: 27 %
Lymphs Abs: 3.2 10*3/uL (ref 0.7–4.0)
MCH: 29.5 pg (ref 26.0–34.0)
MCHC: 33.9 g/dL (ref 30.0–36.0)
MCV: 86.8 fL (ref 80.0–100.0)
Monocytes Absolute: 0.9 10*3/uL (ref 0.1–1.0)
Monocytes Relative: 7 %
Neutro Abs: 7.7 10*3/uL (ref 1.7–7.7)
Neutrophils Relative %: 64 %
Platelets: 335 10*3/uL (ref 150–400)
RBC: 4.48 MIL/uL (ref 3.87–5.11)
RDW: 13.2 % (ref 11.5–15.5)
WBC: 12.1 10*3/uL — ABNORMAL HIGH (ref 4.0–10.5)
nRBC: 0 % (ref 0.0–0.2)

## 2023-01-07 LAB — BASIC METABOLIC PANEL
Anion gap: 9 (ref 5–15)
BUN: 8 mg/dL (ref 6–20)
CO2: 24 mmol/L (ref 22–32)
Calcium: 9.1 mg/dL (ref 8.9–10.3)
Chloride: 104 mmol/L (ref 98–111)
Creatinine, Ser: 0.67 mg/dL (ref 0.44–1.00)
GFR, Estimated: >60 mL/min (ref >60)
Glucose, Bld: 127 mg/dL — ABNORMAL HIGH (ref 70–99)
Potassium: 3.9 mmol/L (ref 3.5–5.1)
Sodium: 137 mmol/L (ref 135–145)

## 2023-01-07 LAB — HCG, QUANTITATIVE, PREGNANCY: hCG, Beta Chain, Quant, S: 1861 m[IU]/mL — ABNORMAL HIGH (ref <5)

## 2023-01-07 LAB — PROTIME-INR
INR: 0.9 (ref 0.8–1.2)
Prothrombin Time: 12.6 seconds (ref 11.4–15.2)

## 2023-01-07 LAB — ABO/RH
ABO/RH(D): O NEG
Antibody Screen: NEGATIVE

## 2023-01-07 MED ORDER — CEPHALEXIN 500 MG PO CAPS: 500.0000 mg | ORAL_CAPSULE | Freq: Three times a day (TID) | ORAL | 0 refills | Status: AC

## 2023-01-07 NOTE — Discharge Instructions (Addendum)
You have a small amount of bacteria in your urine for which we will prescribe Keflex 500 mg 3 times daily for 4 days.  Please schedule an appointment to establish prenatal care, ensure resolution of your bleeding and to follow-up your hCG levels.  Your laboratory evaluation was reassuring today.  Your ultrasound revealed a possible early miscarriage: FINDINGS:  Intrauterine gestational sac: 1 versus 2, with very irregular sac  shapes    Yolk sac:  Not Visualized.    Embryo:  Not Visualized.    MSD: 9 mm   5 w   5 d    Subchorionic hemorrhage:  Small subchorionic hemorrhage.    Maternal uterus/adnexae: Left ovary not directly visualized. Normal  right ovary. No mass or abnormal free fluid identified.    IMPRESSION:  Single versus twin intrauterine gestational sacs, with very  irregular sac shapes, which is a poor prognostic sign. Recommend  continued follow-up with ultrasound and beta hCG levels.

## 2023-01-07 NOTE — ED Provider Notes (Signed)
[place] [facility] Provider Note   CSN: [id number] Arrival date & time: 01/07/23  0710     History  Chief Complaint  Patient presents with   Vaginal Bleeding    Katelyn Bonilla is a 32 y.o. female.   Vaginal Bleeding    32 year old G2P1 female at roughly [redacted] weeks pregnant by LMP and ultrasound presents to the emergency department today with spotting.  The patient states that she had some cramping yesterday and vaginal spotting and bleeding.  She went to her PCP in [facility] yesterday and had an ultrasound which confirmed IUP with positive fetal heart tones.  The patient states that she was administered a 1 cc progesterone shot and the bleeding has eased off.  She was advised to present to the emergency department if the bleeding did not stop and so that is what she is doing this morning.  She states that the bleeding has eased off to the point where she is just having spotting and has not even bled through a single pad.  She denies any abdominal pain at this time, had some cramping yesterday.  She denies any urinary symptoms.  Home Medications Prior to Admission medications   Medication Sig Start Date End Date Taking? Authorizing Provider  cephALEXin (KEFLEX) 500 MG capsule Take 1 capsule (500 mg total) by mouth 3 (three) times daily for 4 days. 01/07/23 01/11/23 Yes Ernie Avena, MD  Cholecalciferol (VITAMIN D3) 1.25 MG (50000 UT) CAPS Take by mouth.   Yes [provider]  estradiol (CLIMARA - DOSED IN MG/24 HR) 0.1 mg/24hr patch Place 0.1 mg onto the skin once a week.   Yes [provider]  metFORMIN (GLUCOPHAGE) 500 MG tablet Take by mouth 2 (two) times daily with a meal.   Yes [provider]  labetalol (NORMODYNE) 200 MG tablet Take 2 tablets (400 mg total) by mouth 2 (two) times daily. 06/14/20   Philip Aspen, DO  omeprazole (PRILOSEC) 20 MG capsule Take 20 mg by mouth daily. 05/10/20   [provider]   Prenatal Vit-Fe Fumarate-FA (PRENATAL MULTIVITAMIN) TABS tablet Take 1 tablet by mouth daily at 12 noon.    [provider]      Allergies    Patient has no known allergies.    Review of Systems   Review of Systems  Genitourinary:  Positive for vaginal bleeding.  All other systems reviewed and are negative.   Physical Exam Updated Vital Signs BP 128/82   Pulse 87   Temp 98.2 F (36.8 C) (Oral)   Resp 16   Ht 5\' 1"  (1.549 m)   Wt 105 kg   LMP  (LMP Unknown) Comment: embryo transfer date on 12/20/22 - IVF  SpO2 99%   Breastfeeding Unknown   BMI 43.74 kg/m  Physical Exam Vitals and nursing note reviewed. Exam conducted with a chaperone present.  Constitutional:      General: She is not in acute distress. HENT:     Head: Normocephalic and atraumatic.  Eyes:     Conjunctiva/sclera: Conjunctivae normal.     Pupils: Pupils are equal, round, and reactive to light.  Cardiovascular:     Rate and Rhythm: Normal rate and regular rhythm.  Pulmonary:     Effort: Pulmonary effort is normal. No respiratory distress.  Abdominal:     General: There is no distension.     Tenderness: There is no guarding.  Genitourinary:    Comments: Cervix closed, small amount of dark  blood in the anterior fornix Musculoskeletal:        General: No deformity or signs of injury.     Cervical back: Neck supple.  Skin:    Findings: No lesion or rash.  Neurological:     General: No focal deficit present.     Mental Status: She is alert. Mental status is at baseline.     ED Results / Procedures / Treatments   Labs (all labs ordered are listed, but only abnormal results are displayed) Labs Reviewed  CBC WITH DIFFERENTIAL/PLATELET - Abnormal; Notable for the following components:      Result Value   WBC 12.1 (*)    All other components within normal limits  BASIC METABOLIC PANEL - Abnormal; Notable for the following components:   Glucose, Bld 127 (*)    All other components within  normal limits  HCG, QUANTITATIVE, PREGNANCY - Abnormal; Notable for the following components:   hCG, Beta Chain, Quant, S 1,861 (*)    All other components within normal limits  URINALYSIS, W/ REFLEX TO CULTURE (INFECTION SUSPECTED) - Abnormal; Notable for the following components:   Hgb urine dipstick MODERATE (*)    Bacteria, UA RARE (*)    All other components within normal limits  PROTIME-INR  ABO/RH    EKG None  Radiology US OB LESS THAN 14 WEEKS WITH OB TRANSVAGINAL  Result Date: 01/07/2023 CLINICAL DATA:  Vaginal bleeding and cramping in 1st trimester pregnancy. IVF Embryo transfer. EXAM: OBSTETRIC <14 WK [place] AND TRANSVAGINAL OB US TECHNIQUE: Both transabdominal and transvaginal ultrasound examinations were performed for complete evaluation of the gestation as well as the maternal uterus, adnexal regions, and pelvic cul-de-sac. Transvaginal technique was performed to assess early pregnancy. COMPARISON:  None Available. FINDINGS: Intrauterine gestational sac: 1 versus 2, with very irregular sac shapes Yolk sac:  Not Visualized. Embryo:  Not Visualized. MSD: 9 mm   5 w   5 d Subchorionic hemorrhage:  Small subchorionic hemorrhage. Maternal uterus/adnexae: Left ovary not directly visualized. Normal right ovary. No mass or abnormal free fluid identified. IMPRESSION: Single versus twin intrauterine gestational sacs, with very irregular sac shapes, which is a poor prognostic sign. Recommend continued follow-up with ultrasound and beta hCG levels. Electronically Signed   By: Danae Orleans M.D.   On: 01/07/2023 11:13    Procedures Procedures    Medications Ordered in ED Medications - No data to display  ED Course/ Medical Decision Making/ A&P                             Medical Decision Making Amount and/or Complexity of Data Reviewed Labs: ordered. Radiology: ordered.  Risk Prescription drug management.     32 year old G2P1 female at roughly [redacted] weeks pregnant by LMP and  ultrasound presents to the emergency department today with spotting.  The patient states that she had some cramping yesterday and vaginal spotting and bleeding.  She went to her PCP in [facility] yesterday and had an ultrasound which confirmed IUP with positive fetal heart tones.  The patient states that she was administered a 1 cc progesterone shot and the bleeding has eased off.  She was advised to present to the emergency department if the bleeding did not stop and so that is what she is doing this morning.  She states that the bleeding has eased off to the point where she is just having spotting and has not even bled through a single pad.  She denies any abdominal pain at this time, had some cramping yesterday.  She denies any urinary symptoms.  On arrival, the patient was vitally stable, afebrile, not tachycardic or tachypneic, BP 157/94, subsequently 128/82 without intervention, saturating well on room air.  Sinus rhythm noted on cardiac telemetry.  Physical exam significant for an abdominal exam that was soft, nontender, nondistended, no rebound or guarding.  Pelvic exam performed with a close cervix noted, small amount of bleeding present.  The patient is O-. I confirmed with on-call OBGYN that RhoGham is NOT indicated this early in pregnancy.   Laboratory evaluation significant for hCG 1861, CBC with a nonspecific leukocytosis to 12.1, hemoglobin 13.2, BMP with mild hyperglycemia 127 otherwise unremarkable, urinalysis with moderate hemoglobin, rare bacteria present.  She has no urinary symptoms however given her asymptomatic bacteriuria of pregnancy will treat with Keflex 500 mg 3 times daily for 4 days.  OB US:  FINDINGS:  Intrauterine gestational sac: 1 versus 2, with very irregular sac  shapes    Yolk sac:  Not Visualized.    Embryo:  Not Visualized.    MSD: 9 mm   5 w   5 d    Subchorionic hemorrhage:  Small subchorionic hemorrhage.    Maternal uterus/adnexae: Left ovary not  directly visualized. Normal  right ovary. No mass or abnormal free fluid identified.    IMPRESSION:  Single versus twin intrauterine gestational sacs, with very  irregular sac shapes, which is a poor prognostic sign. Recommend  continued follow-up with ultrasound and beta hCG levels.    Patient was advised to follow-up outpatient with OB for continued care and monitoring.  Stable for discharge.  Final Clinical Impression(s) / ED Diagnoses Final diagnoses:  Vaginal bleeding in pregnancy  Asymptomatic bacteriuria of pregnancy    Rx / DC Orders ED Discharge Orders          Ordered    cephALEXin (KEFLEX) 500 MG capsule  3 times daily        01/07/23 1009              Ernie Avena, MD 01/07/23 1120

## 2023-01-07 NOTE — ED Triage Notes (Signed)
Pt reports she is [redacted] weeks pregnant. Had spotting yesterday. Saw pcp yesterday and had ultrasound done at that time. Last night began to have cramping and heavier bleeding. Called PCP and told to take 1ml progesterone shot. Still bleeding this am, but not has heavy.

## 2023-01-07 NOTE — ED Notes (Signed)
Family at bedside. 

## 2023-06-28 NOTE — L&D Delivery Note (Signed)
 Delivery Note After her epidural, pt labored well and quickly to complete. She did need her epidural redosed once. Pt pushed for 10-71mins and at 5:47 AM a viable female was delivered via Vaginal, Spontaneous (Presentation: Middle Occiput Anterior).  APGAR: 9, 9; weight 6 lb 8.4 oz (2960 g). Anterior and posterior shoulders delivered with next two pushes. Body easily followed Cord clamped and cut after a minute delay with baby on mothers' abdomen. Baby with vigorous spontaneous cry at delivery.    Placenta status: Spontaneous;Pathology, Intact.  Cleatus. Cord: 3 vessels with the following complications: None.  Cord pH: n/a  Anesthesia: Epidural Episiotomy: None Lacerations: 1st degree;Perineal Suture Repair: 3.0 vicryl Est. Blood Loss (mL): 368  Mom to ob specialty care .  Baby to NICU.  Ted LELON Solo 06/07/2024, 6:13 AM

## 2023-07-06 ENCOUNTER — Encounter (HOSPITAL_BASED_OUTPATIENT_CLINIC_OR_DEPARTMENT_OTHER): Payer: Self-pay | Admitting: Emergency Medicine

## 2023-07-06 ENCOUNTER — Emergency Department (HOSPITAL_BASED_OUTPATIENT_CLINIC_OR_DEPARTMENT_OTHER): Payer: 59

## 2023-07-06 ENCOUNTER — Other Ambulatory Visit: Payer: Self-pay

## 2023-07-06 ENCOUNTER — Emergency Department (HOSPITAL_BASED_OUTPATIENT_CLINIC_OR_DEPARTMENT_OTHER)
Admission: EM | Admit: 2023-07-06 | Discharge: 2023-07-06 | Disposition: A | Discharge status: Home / Self Care | Payer: 59 | Attending: Emergency Medicine | Admitting: Emergency Medicine

## 2023-07-06 DIAGNOSIS — E876 Hypokalemia: Secondary | ICD-10-CM | POA: Insufficient documentation

## 2023-07-06 DIAGNOSIS — K8070 Calculus of gallbladder and bile duct without cholecystitis without obstruction: Secondary | ICD-10-CM | POA: Diagnosis not present

## 2023-07-06 DIAGNOSIS — D72829 Elevated white blood cell count, unspecified: Secondary | ICD-10-CM | POA: Insufficient documentation

## 2023-07-06 DIAGNOSIS — R748 Abnormal levels of other serum enzymes: Secondary | ICD-10-CM | POA: Insufficient documentation

## 2023-07-06 DIAGNOSIS — K8050 Calculus of bile duct without cholangitis or cholecystitis without obstruction: Secondary | ICD-10-CM

## 2023-07-06 DIAGNOSIS — R1011 Right upper quadrant pain: Secondary | ICD-10-CM | POA: Diagnosis present

## 2023-07-06 DIAGNOSIS — K8020 Calculus of gallbladder without cholecystitis without obstruction: Secondary | ICD-10-CM

## 2023-07-06 LAB — CBC
HCT: 38.6 % (ref 36.0–46.0)
Hemoglobin: 13.5 g/dL (ref 12.0–15.0)
MCH: 30.2 pg (ref 26.0–34.0)
MCHC: 35 g/dL (ref 30.0–36.0)
MCV: 86.4 fL (ref 80.0–100.0)
Platelets: 354 10*3/uL (ref 150–400)
RBC: 4.47 MIL/uL (ref 3.87–5.11)
RDW: 13.2 % (ref 11.5–15.5)
WBC: 14.6 10*3/uL — ABNORMAL HIGH (ref 4.0–10.5)
nRBC: 0 % (ref 0.0–0.2)

## 2023-07-06 LAB — URINALYSIS, ROUTINE W REFLEX MICROSCOPIC
Bilirubin Urine: NEGATIVE
Glucose, UA: NEGATIVE mg/dL
Hgb urine dipstick: NEGATIVE
Ketones, ur: NEGATIVE mg/dL
Leukocytes,Ua: NEGATIVE
Nitrite: NEGATIVE
Protein, ur: NEGATIVE mg/dL
Specific Gravity, Urine: 1.02 (ref 1.005–1.030)
pH: 8 (ref 5.0–8.0)

## 2023-07-06 LAB — COMPREHENSIVE METABOLIC PANEL
ALT: 19 U/L (ref 0–44)
AST: 19 U/L (ref 15–41)
Albumin: 4.3 g/dL (ref 3.5–5.0)
Alkaline Phosphatase: 61 U/L (ref 38–126)
Anion gap: 10 (ref 5–15)
BUN: 15 mg/dL (ref 6–20)
CO2: 22 mmol/L (ref 22–32)
Calcium: 9.8 mg/dL (ref 8.9–10.3)
Chloride: 106 mmol/L (ref 98–111)
Creatinine, Ser: 0.81 mg/dL (ref 0.44–1.00)
GFR, Estimated: >60 mL/min (ref >60)
Glucose, Bld: 91 mg/dL (ref 70–99)
Potassium: 3.4 mmol/L — ABNORMAL LOW (ref 3.5–5.1)
Sodium: 138 mmol/L (ref 135–145)
Total Bilirubin: 0.4 mg/dL (ref 0.0–1.2)
Total Protein: 6.8 g/dL (ref 6.5–8.1)

## 2023-07-06 LAB — LIPASE, BLOOD: Lipase: 54 U/L — ABNORMAL HIGH (ref 11–51)

## 2023-07-06 LAB — PREGNANCY, URINE: Preg Test, Ur: NEGATIVE

## 2023-07-06 MED ORDER — MORPHINE SULFATE (PF) 4 MG/ML IV SOLN
4.0000 mg | Freq: Once | INTRAVENOUS | Status: AC
  Administered 2023-07-06: 4 mg via INTRAVENOUS
  Filled 2023-07-06: qty 1

## 2023-07-06 MED ORDER — ONDANSETRON HCL 4 MG/2ML IJ SOLN
4.0000 mg | Freq: Once | INTRAMUSCULAR | Status: AC
  Administered 2023-07-06: 4 mg via INTRAVENOUS
  Filled 2023-07-06: qty 2

## 2023-07-06 MED ORDER — OXYCODONE-ACETAMINOPHEN 5-325 MG PO TABS: 1.0000 | ORAL_TABLET | ORAL | 0 refills | Status: DC | PRN

## 2023-07-06 MED ORDER — ONDANSETRON 4 MG PO TBDP: 4.0000 mg | ORAL_TABLET | Freq: Three times a day (TID) | ORAL | 0 refills | Status: DC | PRN

## 2023-07-06 MED ORDER — POTASSIUM CHLORIDE CRYS ER 20 MEQ PO TBCR
40.0000 meq | EXTENDED_RELEASE_TABLET | Freq: Once | ORAL | Status: AC
  Administered 2023-07-06: 40 meq via ORAL
  Filled 2023-07-06: qty 2

## 2023-07-06 NOTE — ED Provider Notes (Signed)
 [place] [facility] Provider Note   CSN: [id number] Arrival date & time: 07/06/23  0215     History  Chief Complaint  Patient presents with   Abdominal Pain    Brilynn Biasi is a 33 y.o. female.  The history is provided by the patient.  Abdominal Pain She has history of hyperlipidemia, polycystic ovarian syndrome and comes in because of right upper quadrant pain radiating to the back.  Pain started today, but she has had to be other episodes similar to this in the las 2 months, including an episode where she went to [facility] 1 week ago.  There is no associated nausea.  Nothing seems to make the pain better or worse.  She did take a dose of naproxen without any relief.  First 2 episodes were relatively short-lived.  She has not noted any food intolerances.   Home Medications Prior to Admission medications   Medication Sig Start Date End Date Taking? Authorizing Provider  Cholecalciferol (VITAMIN D3) 1.25 MG (50000 UT) CAPS Take by mouth.    [provider]  estradiol (CLIMARA - DOSED IN MG/24 HR) 0.1 mg/24hr patch Place 0.1 mg onto the skin once a week.    [provider]  labetalol  (NORMODYNE ) 200 MG tablet Take 2 tablets (400 mg total) by mouth 2 (two) times daily. 06/14/20   Lilton Legions, DO  metFORMIN  (GLUCOPHAGE ) 500 MG tablet Take by mouth 2 (two) times daily with a meal.    [provider]  omeprazole (PRILOSEC) 20 MG capsule Take 20 mg by mouth daily. 05/10/20   [provider]  Prenatal Vit-Fe Fumarate-FA (PRENATAL MULTIVITAMIN) TABS tablet Take 1 tablet by mouth daily at 12 noon.    [provider]      Allergies    Patient has no known allergies.    Review of Systems   Review of Systems  Gastrointestinal:  Positive for abdominal pain.  All other systems reviewed and are negative.   Physical Exam Updated Vital Signs BP (!) 159/77 (BP Location: Right Arm)    Pulse 68   Temp 97.8 F (36.6 C) (Oral)   Resp (!) 24   Ht 5' (1.524 m)   Wt 86.6 kg   LMP 07/02/2023 Comment: embryo transfer date on 12/20/22 - IVF  SpO2 100%   Breastfeeding No   BMI 37.30 kg/m  Physical Exam Vitals and nursing note reviewed.   33 year old female, resting comfortably and in no acute distress. Vital signs are significant for elevated blood pressure and slightly elevated respiratory rate. Oxygen saturation is 100%, which is normal. Head is normocephalic and atraumatic. PERRLA, EOMI. Oropharynx is clear. Neck is nontender and supple without adenopathy. Back is nontender and there is no CVA tenderness. Lungs are clear without rales, wheezes, or rhonchi. Chest is nontender. Heart has regular rate and rhythm without murmur. Abdomen is soft, flat, with marked right upper quadrant tenderness and positive Murphy sign. Skin is warm and dry without rash. Neurologic: Mental status is normal, moves all extremities equally.  ED Results / Procedures / Treatments   Labs (all labs ordered are listed, but only abnormal results are displayed) Labs Reviewed  LIPASE, BLOOD - Abnormal; Notable for the following components:      Result Value   Lipase 54 (*)    All other components within normal limits  COMPREHENSIVE METABOLIC PANEL - Abnormal; Notable for the following components:   Potassium 3.4 (*)  All other components within normal limits  CBC - Abnormal; Notable for the following components:   WBC 14.6 (*)    All other components within normal limits  URINALYSIS, ROUTINE W REFLEX MICROSCOPIC  PREGNANCY, URINE    EKG EKG Interpretation Date/Time:  Thursday July 06 2023 02:39:45 EST Ventricular Rate:  67 PR Interval:  163 QRS Duration:  93 QT Interval:  416 QTC Calculation: 440 R Axis:   77  Text Interpretation: Sinus rhythm Borderline T abnormalities, anterior leads No old tracing to compare Confirmed by Raford Lenis ([id number]) on 07/06/2023 2:51:27  AM  Radiology No results found.  Procedures Procedures    Medications Ordered in ED Medications  morphine  (PF) 4 MG/ML injection 4 mg (4 mg Intravenous Given 07/06/23 0305)  ondansetron  (ZOFRAN ) injection 4 mg (4 mg Intravenous Given 07/06/23 0305)  potassium chloride  SA (KLOR-CON  M) CR tablet 40 mEq (40 mEq Oral Given 07/06/23 9285)    ED Course/ Medical Decision Making/ A&P                                 Medical Decision Making Amount and/or Complexity of Data Reviewed Labs: ordered. Radiology: ordered.  Risk Prescription drug management.   Right upper quadrant pain concerning for biliary colic.  Differential diagnosis includes, but is not limited to, pancreatitis, peptic ulcer disease, GERD, diverticulitis.  I have reviewed her past records, and note ED visit at [facility] on 06/30/2023 at which time lipase was elevated at 134 (reference range 11-82) and leukocytosis of 17.3 was noted.  Remainder of labs were normal and CT of abdomen and pelvis were normal.  I have reviewed her labs today, and my interpretation is normal urinalysis, mild leukocytosis which is nonspecific, WBC lower than it was recorded 1 week ago.  Lipase is minimally elevated.  Mild hypokalemia is present and the remainder of comprehensive metabolic panel was normal including transaminases and alkaline phosphatase and total bilirubin.  I have ordered morphine  for pain.  She will need right upper quadrant ultrasound and, if negative, HIDA scan.  She had good relief of pain with above-noted treatment.  I have ordered right upper quadrant ultrasound.    Ultrasound shows cholelithiasis with gallbladder wall thickening and no positive sonographic Murphy sign or pericholecystic fluid.  Have independently viewed the images, and agree with the radiologist's interpretation.  Patient continues to be pain-free, clinically does not have cholecystitis.  I am discharging her with a prescription for ondansetron  oral  dissolving tablet and a small number of oxycodone -acetaminophen  tablets, I am referring her to general surgery for elective cholecystectomy.  Final Clinical Impression(s) / ED Diagnoses Final diagnoses:  Biliary colic  Calculus of gallbladder without cholecystitis without obstruction  Hypokalemia  Elevated lipase    Rx / DC Orders ED Discharge Orders          Ordered    ondansetron  (ZOFRAN -ODT) 4 MG disintegrating tablet  Every 8 hours PRN        07/06/23 0707    oxyCODONE -acetaminophen  (PERCOCET) 5-325 MG tablet  Every 4 hours PRN        07/06/23 0707              Raford Lenis, MD 07/06/23 [phone]

## 2023-07-06 NOTE — ED Notes (Signed)
 ED Provider at bedside.

## 2023-07-06 NOTE — ED Triage Notes (Signed)
 Right upper quadrant pain, off and on for 2 months, sometimes takes OTC meds and pain goes away, sometimes has to come to ED for relief.  Seen at [facility] for same.

## 2023-07-06 NOTE — Discharge Instructions (Addendum)
 Your ultrasound shows you have stones in your gallbladder.  You need to have the gallbladder removed.  Please make an appointment to see the surgeon as soon as possible.  Return to the emergency department if pain is not being adequately controlled at home, if you start running a fever or have uncontrolled vomiting.

## 2023-07-06 NOTE — ED Notes (Signed)
US tech at the bedside

## 2023-07-09 ENCOUNTER — Encounter (HOSPITAL_BASED_OUTPATIENT_CLINIC_OR_DEPARTMENT_OTHER): Payer: Self-pay | Admitting: Emergency Medicine

## 2023-07-09 ENCOUNTER — Emergency Department (HOSPITAL_BASED_OUTPATIENT_CLINIC_OR_DEPARTMENT_OTHER)
Admission: EM | Admit: 2023-07-09 | Discharge: 2023-07-10 | Disposition: A | Discharge status: Home / Self Care | Payer: 59 | Source: Home / Self Care | Attending: Emergency Medicine | Admitting: Emergency Medicine

## 2023-07-09 DIAGNOSIS — T7840XA Allergy, unspecified, initial encounter: Secondary | ICD-10-CM

## 2023-07-09 DIAGNOSIS — K8012 Calculus of gallbladder with acute and chronic cholecystitis without obstruction: Secondary | ICD-10-CM | POA: Diagnosis not present

## 2023-07-09 DIAGNOSIS — I10 Essential (primary) hypertension: Secondary | ICD-10-CM | POA: Diagnosis not present

## 2023-07-09 DIAGNOSIS — X58XXXA Exposure to other specified factors, initial encounter: Secondary | ICD-10-CM | POA: Diagnosis not present

## 2023-07-09 DIAGNOSIS — H02849 Edema of unspecified eye, unspecified eyelid: Secondary | ICD-10-CM | POA: Insufficient documentation

## 2023-07-09 DIAGNOSIS — T7849XA Other allergy, initial encounter: Secondary | ICD-10-CM | POA: Diagnosis not present

## 2023-07-09 MED ORDER — DIPHENHYDRAMINE HCL 50 MG/ML IJ SOLN
25.0000 mg | Freq: Once | INTRAMUSCULAR | Status: AC
  Administered 2023-07-09: 25 mg via INTRAVENOUS
  Filled 2023-07-09: qty 1

## 2023-07-09 MED ORDER — FAMOTIDINE IN NACL 20-0.9 MG/50ML-% IV SOLN
20.0000 mg | Freq: Once | INTRAVENOUS | Status: AC
  Administered 2023-07-09: 20 mg via INTRAVENOUS
  Filled 2023-07-09: qty 50

## 2023-07-09 MED ORDER — PREDNISONE 20 MG PO TABS
ORAL_TABLET | ORAL | 0 refills | Status: DC
  Filled 2023-07-09: qty 11, fill #0

## 2023-07-09 MED ORDER — METHYLPREDNISOLONE SODIUM SUCC 125 MG IJ SOLR
125.0000 mg | INTRAMUSCULAR | Status: AC
  Administered 2023-07-09: 125 mg via INTRAVENOUS
  Filled 2023-07-09: qty 2

## 2023-07-09 MED ORDER — FAMOTIDINE 20 MG PO TABS
20.0000 mg | ORAL_TABLET | Freq: Two times a day (BID) | ORAL | 0 refills | Status: DC
  Filled 2023-07-09: qty 10, 5d supply, fill #0

## 2023-07-09 NOTE — ED Provider Notes (Signed)
[place] [facility] Provider Note   CSN: [id number] Arrival date & time: 07/09/23  2253     History  Chief Complaint  Patient presents with   Allergic Reaction    Katelyn Bonilla is a 33 y.o. female.  The history is provided by the patient and the spouse.  Allergic Reaction Presenting symptoms: no difficulty breathing and no wheezing   Presenting symptoms comment:  Eyelid swelling  Severity:  Moderate Prior allergic episodes:  No prior episodes Context: food and medications   Context comment:  Beef soup with a myriad of spices and then oxycodone , swelling started immediately post oxycodone  Relieved by:  Nothing Worsened by:  Nothing Ineffective treatments:  None tried      Home Medications Prior to Admission medications   Medication Sig Start Date End Date Taking? Authorizing Provider  Cholecalciferol (VITAMIN D3) 1.25 MG (50000 UT) CAPS Take by mouth.    [provider]  estradiol (CLIMARA - DOSED IN MG/24 HR) 0.1 mg/24hr patch Place 0.1 mg onto the skin once a week.    [provider]  labetalol  (NORMODYNE ) 200 MG tablet Take 2 tablets (400 mg total) by mouth 2 (two) times daily. 06/14/20   Lilton Legions, DO  metFORMIN  (GLUCOPHAGE ) 500 MG tablet Take by mouth 2 (two) times daily with a meal.    [provider]  omeprazole (PRILOSEC) 20 MG capsule Take 20 mg by mouth daily. 05/10/20   [provider]  ondansetron  (ZOFRAN -ODT) 4 MG disintegrating tablet Take 1 tablet (4 mg total) by mouth every 8 (eight) hours as needed for nausea or vomiting. 07/06/23   Raford Lenis, MD  oxyCODONE -acetaminophen  (PERCOCET) 5-325 MG tablet Take 1 tablet by mouth every 4 (four) hours as needed for moderate pain (pain score 4-6). 07/06/23   Raford Lenis, MD  Prenatal Vit-Fe Fumarate-FA (PRENATAL MULTIVITAMIN) TABS tablet Take 1 tablet by mouth daily at 12 noon.    [provider]      Allergies    Patient has  no known allergies.    Review of Systems   Review of Systems  Constitutional:  Negative for fever.  HENT:  Negative for hearing loss.   Respiratory:  Negative for shortness of breath, wheezing and stridor.   All other systems reviewed and are negative.   Physical Exam Updated Vital Signs BP 130/67 (BP Location: Right Arm)   Pulse 63   Temp 97.9 F (36.6 C)   Resp 20   Ht 5' 1 (1.549 m)   Wt 86.6 kg   LMP 07/02/2023 Comment: embryo transfer date on 12/20/22 - IVF  SpO2 99%   BMI 36.09 kg/m  Physical Exam Vitals and nursing note reviewed.  Constitutional:      General: She is not in acute distress.    Appearance: Normal appearance. She is well-developed.  HENT:     Head: Normocephalic and atraumatic.     Nose: Nose normal.     Mouth/Throat:     Mouth: Mucous membranes are moist.     Pharynx: Oropharynx is clear.     Comments: No swelling of the lips tongue or uvula Eyes:     Pupils: Pupils are equal, round, and reactive to light.     Comments: Upper eyelid swelling   Cardiovascular:     Rate and Rhythm: Normal rate and regular rhythm.     Pulses: Normal pulses.     Heart sounds: Normal heart sounds.  Pulmonary:  Effort: Pulmonary effort is normal. No respiratory distress.     Breath sounds: Normal breath sounds. No stridor. No wheezing.  Abdominal:     General: Bowel sounds are normal. There is no distension.     Palpations: Abdomen is soft.     Tenderness: There is no abdominal tenderness. There is no guarding or rebound.  Genitourinary:    Vagina: No vaginal discharge.  Musculoskeletal:        General: Normal range of motion.     Cervical back: Normal range of motion and neck supple.  Skin:    General: Skin is warm and dry.     Capillary Refill: Capillary refill takes less than 2 seconds.     Findings: No erythema or rash.  Neurological:     General: No focal deficit present.     Mental Status: She is alert and oriented to person, place, and time.      Deep Tendon Reflexes: Reflexes normal.  Psychiatric:        Mood and Affect: Mood normal.        Behavior: Behavior normal.     ED Results / Procedures / Treatments   Labs (all labs ordered are listed, but only abnormal results are displayed) Labs Reviewed - No data to display  EKG None  Radiology No results found.  Procedures Procedures    Medications Ordered in ED Medications  diphenhydrAMINE  (BENADRYL ) injection 25 mg (has no administration in time range)  famotidine  (PEPCID ) IVPB 20 mg premix (has no administration in time range)  methylPREDNISolone  sodium succinate (SOLU-MEDROL ) 125 mg/2 mL injection 125 mg (has no administration in time range)    ED Course/ Medical Decision Making/ A&P                                 Medical Decision Making Eyelid swelling   Amount and/or Complexity of Data Reviewed Independent Historian: spouse    Details: See above  External Data Reviewed: labs and notes.    Details: Previous notes and labs reviewed   Risk Prescription drug management. Risk Details: Markedly improved post medication.  No swelling of the lips tongue or uvula.  Do not take oxycodone .  Have advised benadryl  every 6 hours for 2 days.  Prednisone  and pepcid  ordered.  Stable for discharge.  FOllow up with your PMD and allergy as directed.      Final Clinical Impression(s) / ED Diagnoses Final diagnoses:  None   Return for intractable cough, coughing up blood, fevers > 100.4 unrelieved by medication, shortness of breath, intractable vomiting, chest pain, shortness of breath, weakness, numbness, changes in speech, facial asymmetry, abdominal pain, passing out, Inability to tolerate liquids or food, cough, altered mental status or any concerns. No signs of systemic illness or infection. The patient is nontoxic-appearing on exam and vital signs are within normal limits.  I have reviewed the triage vital signs and the nursing notes. Pertinent labs & imaging results  that were available during my care of the patient were reviewed by me and considered in my medical decision making (see chart for details). After history, exam, and medical workup I feel the patient has been appropriately medically screened and is safe for discharge home. Pertinent diagnoses were discussed with the patient. Patient was given return precautions.  Rx / DC Orders ED Discharge Orders     None         Millena Callins, MD 07/10/23  0037  

## 2023-07-09 NOTE — Discharge Instructions (Addendum)
 Do not take oxycodone or alleve,

## 2023-07-09 NOTE — ED Triage Notes (Signed)
 Pt with facial (periorbital) swelling that started at 2230 after eating some soup; no resp distress; no oral swelling noted

## 2023-07-10 ENCOUNTER — Encounter (HOSPITAL_COMMUNITY): Payer: Self-pay

## 2023-07-10 ENCOUNTER — Ambulatory Visit: Payer: Self-pay | Admitting: Surgery

## 2023-07-10 ENCOUNTER — Other Ambulatory Visit: Payer: Self-pay

## 2023-07-10 ENCOUNTER — Other Ambulatory Visit (HOSPITAL_BASED_OUTPATIENT_CLINIC_OR_DEPARTMENT_OTHER): Payer: Self-pay

## 2023-07-10 ENCOUNTER — Observation Stay (HOSPITAL_COMMUNITY)
Admission: AD | Admit: 2023-07-10 | Discharge: 2023-07-12 | Disposition: A | Discharge status: Home / Self Care | Payer: 59 | Source: Ambulatory Visit | Attending: General Surgery | Admitting: General Surgery

## 2023-07-10 DIAGNOSIS — H02849 Edema of unspecified eye, unspecified eyelid: Secondary | ICD-10-CM | POA: Insufficient documentation

## 2023-07-10 DIAGNOSIS — I10 Essential (primary) hypertension: Secondary | ICD-10-CM | POA: Insufficient documentation

## 2023-07-10 DIAGNOSIS — T7849XA Other allergy, initial encounter: Principal | ICD-10-CM | POA: Insufficient documentation

## 2023-07-10 DIAGNOSIS — X58XXXA Exposure to other specified factors, initial encounter: Secondary | ICD-10-CM | POA: Insufficient documentation

## 2023-07-10 DIAGNOSIS — K8000 Calculus of gallbladder with acute cholecystitis without obstruction: Secondary | ICD-10-CM

## 2023-07-10 DIAGNOSIS — K810 Acute cholecystitis: Principal | ICD-10-CM | POA: Diagnosis present

## 2023-07-10 DIAGNOSIS — K8012 Calculus of gallbladder with acute and chronic cholecystitis without obstruction: Principal | ICD-10-CM | POA: Insufficient documentation

## 2023-07-10 DIAGNOSIS — K8001 Calculus of gallbladder with acute cholecystitis with obstruction: Secondary | ICD-10-CM

## 2023-07-10 HISTORY — DX: Calculus of gallbladder with acute cholecystitis without obstruction: K80.00

## 2023-07-10 LAB — COMPREHENSIVE METABOLIC PANEL
ALT: 359 U/L — ABNORMAL HIGH (ref 0–44)
AST: 160 U/L — ABNORMAL HIGH (ref 15–41)
Albumin: 4.3 g/dL (ref 3.5–5.0)
Alkaline Phosphatase: 132 U/L — ABNORMAL HIGH (ref 38–126)
Anion gap: 10 (ref 5–15)
BUN: 13 mg/dL (ref 6–20)
CO2: 20 mmol/L — ABNORMAL LOW (ref 22–32)
Calcium: 9.2 mg/dL (ref 8.9–10.3)
Chloride: 105 mmol/L (ref 98–111)
Creatinine, Ser: 0.56 mg/dL (ref 0.44–1.00)
GFR, Estimated: >60 mL/min (ref >60)
Glucose, Bld: 125 mg/dL — ABNORMAL HIGH (ref 70–99)
Potassium: 4.2 mmol/L (ref 3.5–5.1)
Sodium: 135 mmol/L (ref 135–145)
Total Bilirubin: 0.8 mg/dL (ref 0.0–1.2)
Total Protein: 7.5 g/dL (ref 6.5–8.1)

## 2023-07-10 LAB — SURGICAL PCR SCREEN
MRSA, PCR: NEGATIVE
Staphylococcus aureus: NEGATIVE

## 2023-07-10 LAB — CBC
HCT: 44.3 % (ref 36.0–46.0)
Hemoglobin: 14.8 g/dL (ref 12.0–15.0)
MCH: 30 pg (ref 26.0–34.0)
MCHC: 33.4 g/dL (ref 30.0–36.0)
MCV: 89.9 fL (ref 80.0–100.0)
Platelets: 354 10*3/uL (ref 150–400)
RBC: 4.93 MIL/uL (ref 3.87–5.11)
RDW: 13.1 % (ref 11.5–15.5)
WBC: 17.3 10*3/uL — ABNORMAL HIGH (ref 4.0–10.5)
nRBC: 0 % (ref 0.0–0.2)

## 2023-07-10 LAB — HIV ANTIBODY (ROUTINE TESTING W REFLEX): HIV Screen 4th Generation wRfx: NONREACTIVE

## 2023-07-10 LAB — LIPASE, BLOOD: Lipase: 122 U/L — ABNORMAL HIGH (ref 11–51)

## 2023-07-10 MED ORDER — PREDNISONE 20 MG PO TABS: ORAL_TABLET | ORAL | 0 refills | Status: DC

## 2023-07-10 MED ORDER — ONDANSETRON 4 MG PO TBDP: 4.0000 mg | ORAL_TABLET | Freq: Four times a day (QID) | ORAL | Status: DC | PRN

## 2023-07-10 MED ORDER — FAMOTIDINE 20 MG PO TABS: 20.0000 mg | ORAL_TABLET | Freq: Two times a day (BID) | ORAL | 0 refills | Status: DC

## 2023-07-10 MED ORDER — ENOXAPARIN SODIUM 150 MG/ML IJ SOSY: 40.0000 mg | PREFILLED_SYRINGE | INTRAMUSCULAR | Status: DC

## 2023-07-10 MED ORDER — OXYCODONE HCL 5 MG PO TABS: 5.0000 mg | ORAL_TABLET | ORAL | Status: DC | PRN

## 2023-07-10 MED ORDER — ONDANSETRON HCL 4 MG/2ML IJ SOLN: 4.0000 mg | Freq: Four times a day (QID) | INTRAMUSCULAR | Status: DC | PRN

## 2023-07-10 MED ORDER — METOPROLOL TARTRATE 5 MG/5ML IV SOLN: 5.0000 mg | Freq: Four times a day (QID) | INTRAVENOUS | Status: DC | PRN

## 2023-07-10 MED ORDER — POLYETHYLENE GLYCOL 3350 17 G PO PACK: 17.0000 g | PACK | Freq: Every day | ORAL | Status: DC | PRN

## 2023-07-10 MED ORDER — ACETAMINOPHEN 325 MG PO TABS
650.0000 mg | ORAL_TABLET | Freq: Four times a day (QID) | ORAL | Status: DC | PRN
Start: 2023-07-10 — End: 2023-07-11
  Administered 2023-07-10: 650 mg via ORAL
  Filled 2023-07-10: qty 2

## 2023-07-10 MED ORDER — SODIUM CHLORIDE 0.9 % IV SOLN
2.0000 g | INTRAVENOUS | Status: DC
  Administered 2023-07-10 – 2023-07-11 (×2): 2 g via INTRAVENOUS
  Filled 2023-07-10 (×2): qty 20

## 2023-07-10 MED ORDER — DIPHENHYDRAMINE HCL 50 MG/ML IJ SOLN: 25.0000 mg | Freq: Four times a day (QID) | INTRAMUSCULAR | Status: DC | PRN

## 2023-07-10 MED ORDER — MORPHINE SULFATE (PF) 2 MG/ML IV SOLN
2.0000 mg | INTRAVENOUS | Status: DC | PRN
Start: 2023-07-10 — End: 2023-07-12
  Administered 2023-07-11: 2 mg via INTRAVENOUS
  Filled 2023-07-10: qty 1

## 2023-07-10 MED ORDER — DIPHENHYDRAMINE HCL 25 MG PO CAPS: 25.0000 mg | ORAL_CAPSULE | Freq: Four times a day (QID) | ORAL | Status: DC | PRN

## 2023-07-10 MED ORDER — SODIUM CHLORIDE 0.9 % IV SOLN: 2.0000 g | Freq: Once | INTRAVENOUS | Status: DC

## 2023-07-10 MED ORDER — ENOXAPARIN SODIUM 40 MG/0.4ML IJ SOSY
40.0000 mg | PREFILLED_SYRINGE | INTRAMUSCULAR | Status: DC
  Administered 2023-07-10: 40 mg via SUBCUTANEOUS
  Filled 2023-07-10: qty 0.4

## 2023-07-10 MED ORDER — DEXTROSE 5 % IV SOLN: 2.0000 g | INTRAVENOUS | Status: DC

## 2023-07-10 MED ORDER — ACETAMINOPHEN 650 MG RE SUPP: 650.0000 mg | Freq: Four times a day (QID) | RECTAL | Status: DC | PRN

## 2023-07-10 MED ORDER — SODIUM CHLORIDE 0.9 % IV SOLN: 4.0000 mg | Freq: Four times a day (QID) | INTRAVENOUS | Status: DC | PRN

## 2023-07-10 MED ORDER — POTASSIUM CHLORIDE IN NACL 20-0.9 MEQ/L-% IV SOLN: INTRAVENOUS | Status: DC

## 2023-07-10 MED ORDER — TRAMADOL HCL 50 MG PO TABS
50.0000 mg | ORAL_TABLET | Freq: Four times a day (QID) | ORAL | Status: DC | PRN
  Administered 2023-07-10 – 2023-07-12 (×3): 100 mg via ORAL
  Filled 2023-07-10 (×3): qty 2

## 2023-07-10 MED ORDER — MORPHINE SULFATE (PF) 4 MG/ML IV SOLN: 4.0000 mg | INTRAVENOUS | Status: DC | PRN

## 2023-07-10 MED ORDER — TRAMADOL HCL 50 MG PO TABS: 50.0000 mg | ORAL_TABLET | Freq: Four times a day (QID) | ORAL | Status: DC | PRN

## 2023-07-10 MED ORDER — INDOCYANINE GREEN 25 MG IV SOLR
2.5000 mg | Freq: Once | INTRAVENOUS | Status: AC
  Administered 2023-07-11: 2.5 mg via INTRAVENOUS

## 2023-07-10 MED ORDER — METRONIDAZOLE 500 MG/100ML IV SOLN
500.0000 mg | Freq: Two times a day (BID) | INTRAVENOUS | Status: DC
Start: 2023-07-10 — End: 2023-07-11
  Administered 2023-07-10 – 2023-07-11 (×3): 500 mg via INTRAVENOUS
  Filled 2023-07-10 (×3): qty 100

## 2023-07-10 MED ORDER — KCL IN DEXTROSE-NACL 20-5-0.45 MEQ/L-%-% IV SOLN
INTRAVENOUS | Status: AC
  Filled 2023-07-10 (×2): qty 1000

## 2023-07-10 NOTE — Progress Notes (Signed)
 Patient ID: Katelyn Bonilla, female   DOB: May 24, 1991, 33 y.o.   MRN: [id number] Patient was seen last night in er with likely allergic reaction to oxy. No clinical evidence jaundice. Will await labs.  Looks like she just has cholecystitis untreated. Will start abx and likely lap chole in am tomorrow

## 2023-07-10 NOTE — Plan of Care (Signed)
   Problem: Education: Goal: Knowledge of General Education information will improve Description Including pain rating scale, medication(s)/side effects and non-pharmacologic comfort measures Outcome: Progressing   Problem: Health Behavior/Discharge Planning: Goal: Ability to manage health-related needs will improve Outcome: Progressing

## 2023-07-10 NOTE — H&P (Signed)
 Subjective    Chief Complaint: New Consultation ( Eval of GB)       History of Present Illness: Katelyn Bonilla is a 33 y.o. female who is seen today as an office consultation at the request of Dr. Room for evaluation of New Consultation ( Eval of GB) .     This is a 33 year old female in otherwise good health who presents with several months of intermittent right upper quadrant abdominal pain.  This tends to occur after eating.  Over the last couple of weeks, the pain has become more persistent and is now constant.  She has some associated nausea.  She has some diarrhea.  She does have significant bloating.  She has had 2 ED visits for this problem.  Her second ED visit diagnosed her with cholelithiasis and some mild wall thickening.  She had a reaction to possibly oxycodone  that required ED visit last night.  She is now on a prednisone  Dosepak.     Review of Systems: A complete review of systems was obtained from the patient.  I have reviewed this information and discussed as appropriate with the patient.  See HPI as well for other ROS.   Review of Systems  Constitutional: Negative.   HENT: Negative.    Eyes: Negative.   Respiratory: Negative.    Cardiovascular: Negative.   Gastrointestinal:  Positive for abdominal pain, diarrhea and nausea.  Genitourinary:  Positive for flank pain.  Skin: Negative.   Neurological: Negative.   Endo/Heme/Allergies: Negative.   Psychiatric/Behavioral: Negative.          Medical History: Past Medical History  History reviewed. No pertinent past medical history.     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 No pertinent surgical history.      Allergies      Allergies  Allergen Reactions   Oxycodone  Swelling        Medications Ordered Prior to Encounter        Current Outpatient Medications on File Prior to Visit  Medication Sig Dispense Refill   diphenhydrAMINE  (BENADRYL ) 25 mg capsule Take 25 mg by mouth every 6 (six) hours as needed for Itching       famotidine  (PEPCID ) 20 MG tablet Take 20 mg by mouth 2 (two) times daily       predniSONE  (DELTASONE ) 20 MG tablet 3 tabs po day one, then 2 po daily x 4 days        No current facility-administered medications on file prior to visit.        Family History       Family History  Problem Relation Age of Onset   Diabetes Father          Tobacco Use History  Social History       Tobacco Use  Smoking Status Never  Smokeless Tobacco Never        Social History  Social History        Socioeconomic History   Marital status: Married  Tobacco Use   Smoking status: Never   Smokeless tobacco:  Never  Substance and Sexual Activity   Alcohol use: Never   Drug use: Never    Social Drivers of Health        Food Insecurity: No Food Insecurity (04/15/2020)    Received from [facility]    Hunger Vital Sign     Worried About Running Out of Food in the Last Year: Never true     Ran Out of Food in the Last Year: Never true  Housing Stability: Unknown (07/10/2023)    Housing Stability Vital Sign     Homeless in the Last Year: No        Objective:         Vitals:    07/10/23 1014  BP: 126/82  Pulse: 93  Temp: 36.7 C (98 F)  SpO2: 98%  Weight: 88 kg (194 lb)  Height: 154.9 cm (5' 1)    Body mass index is 36.66 kg/m.   Physical Exam    Constitutional:  WDWN in NAD, conversant, no obvious deformities; lying in bed comfortably Eyes:  Pupils equal, round; sclera mild icterus; moist conjunctiva; no lid lag HENT:  Oral  mucosa moist; good dentition  Neck:  No masses palpated, trachea midline; no thyromegaly Lungs:  CTA bilaterally; normal respiratory effort CV:  Regular rate and rhythm; no murmurs; extremities well-perfused with no edema Abd:  +bowel sounds, soft, tender in RUQ; no palpable organomegaly; no palpable hernias Musc:  Normal gait; no apparent clubbing or cyanosis in extremities Lymphatic:  No palpable cervical or axillary lymphadenopathy Skin:  Warm, dry; no sign of jaundice Psychiatric - alert and oriented x 4; calm mood and affect     Labs, Imaging and Diagnostic Testing: 07/06/23 LFT's WNL   CLINICAL DATA:  Ongoing right upper quadrant pain for several months.   EXAM: ULTRASOUND ABDOMEN LIMITED RIGHT UPPER QUADRANT   COMPARISON:  CT without contrast recently 06/30/2023   FINDINGS: Gallbladder:   There is no positive sonographic Murphy's sign or pericholecystic fluid, but there is thickening in the free wall which measures 5.2 mm.   There are also gallstones layering dependently measuring as much as 1.2 cm in diameter, and small polyps measuring up to 4 mm.   This concerning for cholecystitis as the wall thickening was not seen six days ago, alternatively could be congestive wall edema or hyperplastic cholecystosis.   Common bile duct:   Diameter: 4.8 mm.  No intrahepatic bile duct dilatation.   Liver:   No focal lesion identified. Parenchymal echogenicity is mildly increased consistent with steatosis. Portal vein is patent on color Doppler imaging with normal direction of blood flow towards the liver.   Other: No right upper quadrant ascites.   IMPRESSION: 1. Cholelithiasis with gallbladder wall thickening measuring 5.2 mm. This is concerning for cholecystitis as the wall thickening was not seen six days ago, alternatively could be congestive wall edema or hyperplastic cholecystosis. 2. No positive sonographic Murphy's sign or pericholecystic fluid. 3. No biliary  ductal dilatation. 4. Mild hepatic steatosis.     Electronically Signed   By: Francis Quam M.D.   On: 07/06/2023 06:56   Assessment and Plan:  Diagnoses and all orders for this visit:   Calculus of gallbladder with chronic cholecystitis without obstruction     The patient may be developing signs of jaundice.  Recommend direct admission to [facility] for lab work and possible further imaging.  I explained to the patient that she will likely have laparoscopic cholecystectomy this admission but may also  need other procedures to clear her, bile duct if there is an obstruction.     Hobson Lax DEWAYNE LIMA, MD  07/10/2023 10:24 AM

## 2023-07-11 ENCOUNTER — Observation Stay (HOSPITAL_COMMUNITY): Payer: 59

## 2023-07-11 ENCOUNTER — Observation Stay (HOSPITAL_BASED_OUTPATIENT_CLINIC_OR_DEPARTMENT_OTHER): Payer: 59

## 2023-07-11 ENCOUNTER — Encounter (HOSPITAL_COMMUNITY): Payer: Self-pay

## 2023-07-11 ENCOUNTER — Encounter (HOSPITAL_COMMUNITY): Admission: AD | Disposition: A | Discharge status: Home / Self Care | Payer: Self-pay | Source: Ambulatory Visit

## 2023-07-11 ENCOUNTER — Other Ambulatory Visit: Payer: Self-pay

## 2023-07-11 DIAGNOSIS — K819 Cholecystitis, unspecified: Secondary | ICD-10-CM

## 2023-07-11 DIAGNOSIS — T7849XA Other allergy, initial encounter: Secondary | ICD-10-CM | POA: Diagnosis not present

## 2023-07-11 HISTORY — PX: CHOLECYSTECTOMY: SHX55

## 2023-07-11 LAB — CBC
HCT: 35.7 % — ABNORMAL LOW (ref 36.0–46.0)
Hemoglobin: 12 g/dL (ref 12.0–15.0)
MCH: 30.3 pg (ref 26.0–34.0)
MCHC: 33.6 g/dL (ref 30.0–36.0)
MCV: 90.2 fL (ref 80.0–100.0)
Platelets: 290 10*3/uL (ref 150–400)
RBC: 3.96 MIL/uL (ref 3.87–5.11)
RDW: 13.3 % (ref 11.5–15.5)
WBC: 15.8 10*3/uL — ABNORMAL HIGH (ref 4.0–10.5)
nRBC: 0 % (ref 0.0–0.2)

## 2023-07-11 LAB — COMPREHENSIVE METABOLIC PANEL
ALT: 218 U/L — ABNORMAL HIGH (ref 0–44)
AST: 56 U/L — ABNORMAL HIGH (ref 15–41)
Albumin: 3.3 g/dL — ABNORMAL LOW (ref 3.5–5.0)
Alkaline Phosphatase: 94 U/L (ref 38–126)
Anion gap: 6 (ref 5–15)
BUN: 10 mg/dL (ref 6–20)
CO2: 22 mmol/L (ref 22–32)
Calcium: 8.3 mg/dL — ABNORMAL LOW (ref 8.9–10.3)
Chloride: 108 mmol/L (ref 98–111)
Creatinine, Ser: 0.59 mg/dL (ref 0.44–1.00)
GFR, Estimated: >60 mL/min (ref >60)
Glucose, Bld: 118 mg/dL — ABNORMAL HIGH (ref 70–99)
Potassium: 3.8 mmol/L (ref 3.5–5.1)
Sodium: 136 mmol/L (ref 135–145)
Total Bilirubin: 0.6 mg/dL (ref 0.0–1.2)
Total Protein: 5.7 g/dL — ABNORMAL LOW (ref 6.5–8.1)

## 2023-07-11 LAB — PREGNANCY, URINE: Preg Test, Ur: NEGATIVE

## 2023-07-11 SURGERY — LAPAROSCOPIC CHOLECYSTECTOMY WITH INTRAOPERATIVE CHOLANGIOGRAM
Anesthesia: General

## 2023-07-11 MED ORDER — FENTANYL CITRATE (PF) 100 MCG/2ML IJ SOLN
INTRAMUSCULAR | Status: AC
  Filled 2023-07-11: qty 2

## 2023-07-11 MED ORDER — SUGAMMADEX SODIUM 200 MG/2ML IV SOLN
INTRAVENOUS | Status: DC | PRN
  Administered 2023-07-11: 200 mg via INTRAVENOUS

## 2023-07-11 MED ORDER — STERILE WATER FOR IRRIGATION IR SOLN
Status: DC | PRN
  Administered 2023-07-11: 1000 mL

## 2023-07-11 MED ORDER — LIDOCAINE HCL (CARDIAC) PF 100 MG/5ML IV SOSY
PREFILLED_SYRINGE | INTRAVENOUS | Status: DC | PRN
  Administered 2023-07-11: 100 mg via INTRAVENOUS

## 2023-07-11 MED ORDER — 0.9 % SODIUM CHLORIDE (POUR BTL) OPTIME
TOPICAL | Status: DC | PRN
  Administered 2023-07-11: 1000 mL

## 2023-07-11 MED ORDER — OXYCODONE HCL 5 MG PO TABS: 5.0000 mg | ORAL_TABLET | Freq: Once | ORAL | Status: DC | PRN

## 2023-07-11 MED ORDER — EPHEDRINE 5 MG/ML INJ
INTRAVENOUS | Status: AC
  Filled 2023-07-11: qty 5

## 2023-07-11 MED ORDER — FENTANYL CITRATE PF 50 MCG/ML IJ SOSY
25.0000 ug | PREFILLED_SYRINGE | INTRAMUSCULAR | Status: DC | PRN
  Administered 2023-07-11 (×2): 50 ug via INTRAVENOUS

## 2023-07-11 MED ORDER — FENTANYL CITRATE PF 50 MCG/ML IJ SOSY
PREFILLED_SYRINGE | INTRAMUSCULAR | Status: AC
  Filled 2023-07-11: qty 1

## 2023-07-11 MED ORDER — ONDANSETRON HCL 4 MG/2ML IJ SOLN
INTRAMUSCULAR | Status: AC
  Filled 2023-07-11: qty 2

## 2023-07-11 MED ORDER — ROCURONIUM BROMIDE 100 MG/10ML IV SOLN
INTRAVENOUS | Status: DC | PRN
  Administered 2023-07-11: 50 mg via INTRAVENOUS

## 2023-07-11 MED ORDER — LACTATED RINGERS IR SOLN
Status: DC | PRN
  Administered 2023-07-11: 1000 mL

## 2023-07-11 MED ORDER — PROPOFOL 10 MG/ML IV BOLUS
INTRAVENOUS | Status: AC
Start: 2023-07-11 — End: ?
  Filled 2023-07-11: qty 20

## 2023-07-11 MED ORDER — TRAMADOL HCL 50 MG PO TABS
100.0000 mg | ORAL_TABLET | Freq: Once | ORAL | Status: AC
  Administered 2023-07-11: 100 mg via ORAL

## 2023-07-11 MED ORDER — KETOROLAC TROMETHAMINE 15 MG/ML IJ SOLN
15.0000 mg | Freq: Three times a day (TID) | INTRAMUSCULAR | Status: DC | PRN
  Administered 2023-07-12: 15 mg via INTRAVENOUS
  Filled 2023-07-11: qty 1

## 2023-07-11 MED ORDER — LIDOCAINE HCL (PF) 2 % IJ SOLN
INTRAMUSCULAR | Status: AC
  Filled 2023-07-11: qty 5

## 2023-07-11 MED ORDER — DEXAMETHASONE SODIUM PHOSPHATE 10 MG/ML IJ SOLN
INTRAMUSCULAR | Status: AC
  Filled 2023-07-11: qty 1

## 2023-07-11 MED ORDER — FENTANYL CITRATE (PF) 100 MCG/2ML IJ SOLN
INTRAMUSCULAR | Status: DC | PRN
  Administered 2023-07-11 (×3): 50 ug via INTRAVENOUS
  Administered 2023-07-11: 100 ug via INTRAVENOUS
  Administered 2023-07-11: 50 ug via INTRAVENOUS

## 2023-07-11 MED ORDER — LACTATED RINGERS IV SOLN
INTRAVENOUS | Status: DC
Start: 2023-07-11 — End: 2023-07-12

## 2023-07-11 MED ORDER — CHLORHEXIDINE GLUCONATE 0.12 % MT SOLN
15.0000 mL | Freq: Once | OROMUCOSAL | Status: AC
  Administered 2023-07-11: 15 mL via OROMUCOSAL

## 2023-07-11 MED ORDER — ROCURONIUM BROMIDE 10 MG/ML (PF) SYRINGE
PREFILLED_SYRINGE | INTRAVENOUS | Status: AC
  Filled 2023-07-11: qty 10

## 2023-07-11 MED ORDER — MIDAZOLAM HCL 5 MG/5ML IJ SOLN
INTRAMUSCULAR | Status: DC | PRN
  Administered 2023-07-11: 2 mg via INTRAVENOUS

## 2023-07-11 MED ORDER — ACETAMINOPHEN 10 MG/ML IV SOLN: 1000.0000 mg | Freq: Once | INTRAVENOUS | Status: DC | PRN

## 2023-07-11 MED ORDER — DEXAMETHASONE SODIUM PHOSPHATE 10 MG/ML IJ SOLN
INTRAMUSCULAR | Status: DC | PRN
  Administered 2023-07-11: 10 mg via INTRAVENOUS

## 2023-07-11 MED ORDER — PROPOFOL 10 MG/ML IV BOLUS
INTRAVENOUS | Status: DC | PRN
  Administered 2023-07-11: 150 mg via INTRAVENOUS

## 2023-07-11 MED ORDER — ONDANSETRON HCL 4 MG/2ML IJ SOLN
INTRAMUSCULAR | Status: DC | PRN
  Administered 2023-07-11: 4 mg via INTRAVENOUS

## 2023-07-11 MED ORDER — BUPIVACAINE-EPINEPHRINE 0.25% -1:200000 IJ SOLN
INTRAMUSCULAR | Status: DC | PRN
  Administered 2023-07-11: 10 mL

## 2023-07-11 MED ORDER — MIDAZOLAM HCL 2 MG/2ML IJ SOLN
INTRAMUSCULAR | Status: AC
  Filled 2023-07-11: qty 2

## 2023-07-11 MED ORDER — FENTANYL CITRATE PF 50 MCG/ML IJ SOSY
PREFILLED_SYRINGE | INTRAMUSCULAR | Status: AC
  Administered 2023-07-11: 50 ug
  Filled 2023-07-11: qty 1

## 2023-07-11 MED ORDER — DROPERIDOL 2.5 MG/ML IJ SOLN: 0.6250 mg | Freq: Once | INTRAMUSCULAR | Status: DC | PRN

## 2023-07-11 MED ORDER — PHENYLEPHRINE 80 MCG/ML (10ML) SYRINGE FOR IV PUSH (FOR BLOOD PRESSURE SUPPORT)
PREFILLED_SYRINGE | INTRAVENOUS | Status: AC
  Filled 2023-07-11: qty 10

## 2023-07-11 MED ORDER — OXYCODONE HCL 5 MG/5ML PO SOLN
5.0000 mg | Freq: Once | ORAL | Status: DC | PRN
Start: 2023-07-11 — End: 2023-07-11

## 2023-07-11 MED ORDER — TRAMADOL HCL 50 MG PO TABS
ORAL_TABLET | ORAL | Status: AC
  Filled 2023-07-11: qty 2

## 2023-07-11 MED ORDER — OXYCODONE HCL 5 MG PO TABS
ORAL_TABLET | ORAL | Status: AC
  Filled 2023-07-11: qty 1

## 2023-07-11 MED ORDER — ACETAMINOPHEN 500 MG PO TABS
1000.0000 mg | ORAL_TABLET | Freq: Four times a day (QID) | ORAL | Status: DC
  Administered 2023-07-12 (×2): 1000 mg via ORAL
  Filled 2023-07-11 (×2): qty 2

## 2023-07-11 SURGICAL SUPPLY — 40 items
APPLIER CLIP 5 13 M/L LIGAMAX5 (MISCELLANEOUS) ×1
APPLIER CLIP ROT 10 11.4 M/L (STAPLE)
BAG COUNTER SPONGE SURGICOUNT (BAG) IMPLANT
BENZOIN TINCTURE PRP APPL 2/3 (GAUZE/BANDAGES/DRESSINGS) IMPLANT
CABLE HIGH FREQUENCY MONO STRZ (ELECTRODE) ×1 IMPLANT
CHLORAPREP W/TINT 26 (MISCELLANEOUS) ×1 IMPLANT
CLIP APPLIE 5 13 M/L LIGAMAX5 (MISCELLANEOUS) ×1 IMPLANT
CLIP APPLIE ROT 10 11.4 M/L (STAPLE) IMPLANT
COVER MAYO STAND XLG (MISCELLANEOUS) ×1 IMPLANT
COVER SURGICAL LIGHT HANDLE (MISCELLANEOUS) ×1 IMPLANT
DERMABOND ADVANCED .7 DNX12 (GAUZE/BANDAGES/DRESSINGS) IMPLANT
DRAPE C-ARM 42X120 X-RAY (DRAPES) IMPLANT
ELECT REM PT RETURN 15FT ADLT (MISCELLANEOUS) ×1 IMPLANT
GLOVE BIO SURGEON STRL SZ7 (GLOVE) ×1 IMPLANT
GLOVE BIOGEL PI IND STRL 7.5 (GLOVE) ×1 IMPLANT
GOWN STRL REUS W/ TWL LRG LVL3 (GOWN DISPOSABLE) ×1 IMPLANT
GRASPER SUT TROCAR 14GX15 (MISCELLANEOUS) IMPLANT
HEMOSTAT SNOW SURGICEL 2X4 (HEMOSTASIS) IMPLANT
IRRIG SUCT STRYKERFLOW 2 WTIP (MISCELLANEOUS) ×1
IRRIGATION SUCT STRKRFLW 2 WTP (MISCELLANEOUS) ×1 IMPLANT
KIT BASIN OR (CUSTOM PROCEDURE TRAY) ×1 IMPLANT
KIT IMAGING PINPOINTPAQ (MISCELLANEOUS) IMPLANT
KIT TURNOVER KIT A (KITS) IMPLANT
PENCIL SMOKE EVACUATOR (MISCELLANEOUS) IMPLANT
POUCH RETRIEVAL ECOSAC 10 (ENDOMECHANICALS) ×1 IMPLANT
PROTECTOR NERVE ULNAR (MISCELLANEOUS) IMPLANT
SCISSORS LAP 5X35 DISP (ENDOMECHANICALS) ×1 IMPLANT
SET CHOLANGIOGRAPH MIX (MISCELLANEOUS) IMPLANT
SET TUBE SMOKE EVAC HIGH FLOW (TUBING) ×1 IMPLANT
SLEEVE Z-THREAD 5X100MM (TROCAR) ×2 IMPLANT
SPIKE FLUID TRANSFER (MISCELLANEOUS) ×1 IMPLANT
STRIP CLOSURE SKIN 1/2X4 (GAUZE/BANDAGES/DRESSINGS) IMPLANT
SUT MNCRL AB 4-0 PS2 18 (SUTURE) ×1 IMPLANT
SUT VICRYL 0 TIES 12 18 (SUTURE) IMPLANT
SUT VICRYL 0 UR6 27IN ABS (SUTURE) IMPLANT
TOWEL OR 17X26 10 PK STRL BLUE (TOWEL DISPOSABLE) ×1 IMPLANT
TRAY LAPAROSCOPIC (CUSTOM PROCEDURE TRAY) ×1 IMPLANT
TROCAR 11X100 Z THREAD (TROCAR) IMPLANT
TROCAR BALLN 12MMX100 BLUNT (TROCAR) ×1 IMPLANT
TROCAR Z-THREAD OPTICAL 5X100M (TROCAR) ×1 IMPLANT

## 2023-07-11 NOTE — Anesthesia Preprocedure Evaluation (Signed)
 Anesthesia Evaluation  Patient identified by MRN, date of birth, ID band Patient awake    Reviewed: Allergy & Precautions, H&P , NPO status , Patient's Chart, lab work & pertinent test results  Airway Mallampati: II  TM Distance: >3 FB Neck ROM: Full    Dental no notable dental hx.    Pulmonary neg pulmonary ROS   Pulmonary exam normal breath sounds clear to auscultation       Cardiovascular hypertension, Normal cardiovascular exam Rhythm:Regular Rate:Normal     Neuro/Psych negative neurological ROS  negative psych ROS   GI/Hepatic Neg liver ROS,,,Cholecystitis   Endo/Other    Renal/GU negative Renal ROS  negative genitourinary   Musculoskeletal negative musculoskeletal ROS (+)    Abdominal   Peds negative pediatric ROS (+)  Hematology negative hematology ROS (+)   Anesthesia Other Findings   Reproductive/Obstetrics negative OB ROS                              Anesthesia Physical Anesthesia Plan  ASA: 2  Anesthesia Plan: General   Post-op Pain Management:    Induction: Intravenous  PONV Risk Score and Plan: 2 and Ondansetron  and Dexamethasone   Airway Management Planned: Oral ETT  Additional Equipment:   Intra-op Plan:   Post-operative Plan: Extubation in OR  Informed Consent: I have reviewed the patients History and Physical, chart, labs and discussed the procedure including the risks, benefits and alternatives for the proposed anesthesia with the patient or authorized representative who has indicated his/her understanding and acceptance.     Dental advisory given  Plan Discussed with: CRNA  Anesthesia Plan Comments:          Anesthesia Quick Evaluation

## 2023-07-11 NOTE — TOC CM/SW Note (Signed)
 Transition of Care [facility]) - Inpatient Brief Assessment   Patient Details  Name: Katelyn Bonilla MRN: [id number] Date of Birth: 02-08-1991  Transition of Care [facility]) CM/SW Contact:    Alfonse JONELLE Rex, RN Phone Number: 07/11/2023, 12:08 PM   Clinical Narrative: Met with patient at bedside to introduce role of TOC/NCM and review for dc planning, pt reports she has an established PCP and pharmacy, no current home care services or home DME, reports she feels safe returning home with her family, confirmed transportation is available at discharge. TOC Brief Assessment completed. No TOC needs identified at this time.     Transition of Care Asessment: Insurance and Status: Insurance coverage has been reviewed Patient has primary care physician: Yes Home environment has been reviewed: resides in private residence with spouse Prior level of function:: Independent Prior/Current Home Services: No current home services Social Drivers of Health Review: SDOH reviewed no interventions necessary Readmission risk has been reviewed: Yes Transition of care needs: no transition of care needs at this time

## 2023-07-11 NOTE — Progress Notes (Signed)
*   Day of Surgery *   Subjective/Chief Complaint: Still with ruq pain but much more comfortable   Objective: Vital signs in last 24 hours: Temp:  [97.8 F (36.6 C)-98.4 F (36.9 C)] 98 F (36.7 C) (01/14 0648) Pulse Rate:  [56-79] 56 (01/14 0648) Resp:  [14-18] 17 (01/14 0648) BP: (96-150)/(55-79) 96/58 (01/14 0648) SpO2:  [95 %-99 %] 99 % (01/14 0648) Weight:  [87.3 kg] 87.3 kg (01/13 1222) Last BM Date : 07/08/23  Intake/Output from previous day: 01/13 0701 - 01/14 0700 In: 2947.9 [P.O.:1400; I.V.:1248.1; IV Piggyback:299.8] Out: -  Intake/Output this shift: No intake/output data recorded.  General nad Cv regular Ab soft ruq tender  Lab Results:  Recent Labs    07/10/23 1256 07/11/23 0444  WBC 17.3* 15.8*  HGB 14.8 12.0  HCT 44.3 35.7*  PLT 354 290   BMET Recent Labs    07/10/23 1256 07/11/23 0444  NA 135 136  K 4.2 3.8  CL 105 108  CO2 20* 22  GLUCOSE 125* 118*  BUN 13 10  CREATININE 0.56 0.59  CALCIUM  9.2 8.3*   PT/INR No results for input(s): LABPROT, INR in the last 72 hours. ABG No results for input(s): PHART, HCO3 in the last 72 hours.  Invalid input(s): PCO2, PO2  Studies/Results: No results found.  Anti-infectives: Anti-infectives (From admission, onward)    Start     Dose/Rate Route Frequency Ordered Stop   07/10/23 1430  cefTRIAXone  (ROCEPHIN ) 2 g in sodium chloride  0.9 % 100 mL IVPB        2 g 200 mL/hr over 30 Minutes Intravenous Every 24 hours 07/10/23 1334     07/10/23 1400  metroNIDAZOLE  (FLAGYL ) IVPB 500 mg        500 mg 100 mL/hr over 60 Minutes Intravenous Every 12 hours 07/10/23 1300     07/10/23 1345  cefTRIAXone  (ROCEPHIN ) 2 g in sodium chloride  0.9 % 100 mL IVPB  Status:  Discontinued        2 g 200 mL/hr over 30 Minutes Intravenous  Once 07/10/23 1252 07/10/23 1334       Assessment/Plan: Cholecystitis -do not think she has choledocholithiasis -will proceed with lap chole possible cholangiogram  today -I discussed the procedure in detail.  We discussed the risks and benefits of a laparoscopic cholecystectomy and possible cholangiogram including, but not limited to bleeding, infection, injury to surrounding structures such as the intestine or liver, bile leak, retained gallstones, need to convert to an open procedure, prolonged diarrhea, blood clots such as  DVT, common bile duct injury, anesthesia risks, and possible need for additional procedures.  The likelihood of improvement in symptoms and return to the patient's normal status is good. We discussed the typical post-operative recovery course.   I reviewed last 24 h vitals and pain scores, last 48 h intake and output, last 24 h labs and trends, and last 24 h imaging results.   Katelyn Bonilla 07/11/2023

## 2023-07-11 NOTE — Anesthesia Procedure Notes (Signed)
 Procedure Name: Intubation Date/Time: 07/11/2023 3:25 PM  Performed by: Therisa Doyal CROME, CRNAPre-anesthesia Checklist: Patient identified, Emergency Drugs available, Suction available and Patient being monitored Patient Re-evaluated:Patient Re-evaluated prior to induction Oxygen Delivery Method: Circle system utilized Preoxygenation: Pre-oxygenation with 100% oxygen Induction Type: IV induction Ventilation: Mask ventilation without difficulty and Oral airway inserted - appropriate to patient size Laryngoscope Size: Cleotilde and 2 Grade View: Grade I Tube type: Oral Tube size: 7.0 mm Number of attempts: 1 Airway Equipment and Method: Stylet and Oral airway Placement Confirmation: ETT inserted through vocal cords under direct vision, positive ETCO2 and breath sounds checked- equal and bilateral Secured at: 20 cm Tube secured with: Tape Dental Injury: Teeth and Oropharynx as per pre-operative assessment

## 2023-07-11 NOTE — Transfer of Care (Signed)
 Immediate Anesthesia Transfer of Care Note  Patient: Katelyn Bonilla  Procedure(s) Performed: LAPAROSCOPIC CHOLECYSTECTOMY WITH ICG DYE  Patient Location: PACU  Anesthesia Type:General  Level of Consciousness: sedated  Airway & Oxygen Therapy: Patient Spontanous Breathing and Patient connected to face mask oxygen  Post-op Assessment: Report given to RN and Post -op Vital signs reviewed and stable  Post vital signs: Reviewed and stable  Last Vitals:  Vitals Value Taken Time  BP 126/74 07/11/23 1649  Temp    Pulse 55 07/11/23 1651  Resp 13 07/11/23 1651  SpO2 100 % 07/11/23 1651  Vitals shown include unfiled device data.  Last Pain:  Vitals:   07/11/23 1342  TempSrc: Oral  PainSc: 0-No pain      Patients Stated Pain Goal: 5 (07/11/23 1342)  Complications: No notable events documented.

## 2023-07-11 NOTE — Op Note (Signed)
 Preoperative diagnosis: acute cholecystitis Postoperative diagnosis: acute cholecystitis Procedure: Laparoscopic cholecystectomy Surgeon: Dr. Adina Bury Asst: Burnard Louder, PA-C Anesthesia: General  Estimated blood loss: Less than 50 cc Specimens: Gallbladder and contents to pathology Complications: None Sponge and count was correct completion Disposition to recovery stable condition   Indication: 82 yof with several months intermittent ab pain.  She has cholelithiasis on US , transaminases mildly elevated, tb normal.  She has cholecystitis on exam. We discussed proceeding with lap chole.    Procedure: After informed consent was obtained she was taken to the operating room.  She was given antibiotics.  SCDs were in place.  She was placed under general anesthesia without complication.  She was prepped and draped in standard sterile surgical fashion.  Surgical timeout was then performed.   I infiltrated Marcaine  below the umbilicus.  I made a vertical incision I carried this to the fascia.  The fascia was incised sharply and the peritoneum entered bluntly.  This was done without injury.  I then placed a 0 Vicryl pursestring suture and inserted a Hassan trocar.  The abdomen was insufflated 15 mmHg pressure.  I then inserted 3 additional 5 mm trocars in the epigastrium and right side of the abdomen.  The gallbladder was then retracted cephalad and lateral.  She was noted to have acute cholecystitis.  I was able to dissect in the triangle.  I used ICG dye to confirm the anatomy and the cbd and cystic duct were clearly visualized.  Dye also was in the duodenum. Once I done this I clearly obtained a critical view of safety with a couple of branches of the artery as well as the cystic duct.  I then was able to clip the posterior branch and divided this.  I then treated the cystic duct and the artery that was adherent to it in a similar fashion.  These clips completely traversed the duct and the duct was  viable.  The gallbladder was then removed from the liver bed with some difficulty as it was very chronically attached.  This was then placed in retrieval bag and removed from the abdomen.  Irrigation was performed. I did place some snow in the gb fossa.   Hemostasis was obtained.  I then removed my Hassan trocar.  I then tied my pursestring down and I placed 2 additional 0 Vicryl sutures using the suture passer device to completely obliterate the umbilical defect.  I then removed the remaining trocars.  These were closed with 4 Monocryl and glue.  She tolerated this and was transferred recovery.

## 2023-07-11 NOTE — Plan of Care (Signed)
 ?  Problem: Education: ?Goal: Knowledge of General Education information will improve ?Description: Including pain rating scale, medication(s)/side effects and non-pharmacologic comfort measures ?Outcome: Progressing ?  ?Problem: Health Behavior/Discharge Planning: ?Goal: Ability to manage health-related needs will improve ?Outcome: Progressing ?  ?Problem: Coping: ?Goal: Level of anxiety will decrease ?Outcome: Progressing ?  ?

## 2023-07-12 ENCOUNTER — Encounter (HOSPITAL_COMMUNITY): Payer: Self-pay | Admitting: General Surgery

## 2023-07-12 DIAGNOSIS — T7849XA Other allergy, initial encounter: Secondary | ICD-10-CM | POA: Diagnosis not present

## 2023-07-12 LAB — COMPREHENSIVE METABOLIC PANEL
ALT: 190 U/L — ABNORMAL HIGH (ref 0–44)
AST: 55 U/L — ABNORMAL HIGH (ref 15–41)
Albumin: 3.5 g/dL (ref 3.5–5.0)
Alkaline Phosphatase: 96 U/L (ref 38–126)
Anion gap: 8 (ref 5–15)
BUN: 9 mg/dL (ref 6–20)
CO2: 23 mmol/L (ref 22–32)
Calcium: 8.7 mg/dL — ABNORMAL LOW (ref 8.9–10.3)
Chloride: 106 mmol/L (ref 98–111)
Creatinine, Ser: 0.51 mg/dL (ref 0.44–1.00)
GFR, Estimated: >60 mL/min (ref >60)
Glucose, Bld: 119 mg/dL — ABNORMAL HIGH (ref 70–99)
Potassium: 4.2 mmol/L (ref 3.5–5.1)
Sodium: 137 mmol/L (ref 135–145)
Total Bilirubin: 0.5 mg/dL (ref 0.0–1.2)
Total Protein: 6.2 g/dL — ABNORMAL LOW (ref 6.5–8.1)

## 2023-07-12 MED ORDER — TRAMADOL HCL 50 MG PO TABS: 100.0000 mg | ORAL_TABLET | Freq: Four times a day (QID) | ORAL | 0 refills | Status: DC | PRN

## 2023-07-12 NOTE — Progress Notes (Signed)
 Pt's father is requesting a note for work stating he was with his daughter for her surgery 1/13-1/15 please.

## 2023-07-12 NOTE — Anesthesia Postprocedure Evaluation (Signed)
 Anesthesia Post Note  Patient: Katelyn Bonilla  Procedure(s) Performed: LAPAROSCOPIC CHOLECYSTECTOMY WITH ICG DYE     Patient location during evaluation: PACU Anesthesia Type: General Level of consciousness: awake and alert Pain management: pain level controlled Vital Signs Assessment: post-procedure vital signs reviewed and stable Respiratory status: spontaneous breathing, nonlabored ventilation and respiratory function stable Cardiovascular status: blood pressure returned to baseline and stable Postop Assessment: no apparent nausea or vomiting Anesthetic complications: no   No notable events documented.  Last Vitals:  Vitals:   07/12/23 0634 07/12/23 0854  BP: 121/64 134/76  Pulse: (!) 52 (!) 58  Resp: 18   Temp: 36.6 C 36.7 C  SpO2: 98% 96%    Last Pain:  Vitals:   07/12/23 0800  TempSrc:   PainSc: 3    Pain Goal: Patients Stated Pain Goal: 2 (07/12/23 4098)                 Earvin Goldberg

## 2023-07-12 NOTE — Progress Notes (Signed)
Pt was discharged home today. Instructions were reviewed with patient, and questions were answered. Pt was taken to main entrance via wheelchair by NT.  

## 2023-07-12 NOTE — Discharge Instructions (Signed)

## 2023-07-12 NOTE — Plan of Care (Signed)
   Problem: Education: Goal: Knowledge of General Education information will improve Description: Including pain rating scale, medication(s)/side effects and non-pharmacologic comfort measures Outcome: Progressing   Problem: Nutrition: Goal: Adequate nutrition will be maintained Outcome: Progressing

## 2023-07-12 NOTE — Plan of Care (Signed)

## 2023-07-12 NOTE — Discharge Summary (Signed)
 Physician Discharge Summary  Patient ID: Katelyn Bonilla MRN: [id number] DOB/AGE: January 05, 1991 33 y.o.  Admit date: 07/10/2023 Discharge date: 07/12/2023  Admission Diagnoses: Cholecystitis  Discharge Diagnoses:  Principal Problem:   Acute cholecystitis   Discharged Condition: good  Hospital Course: 33 yof admitted with cholecystitis. Underwent uncomplicated laparoscopic cholecystectomy. Doing well next day ready for dc.  Consults: None  Significant Diagnostic Studies: none  Treatments: surgery: lap chole  Discharge Exam: Blood pressure 121/64, pulse (!) 52, temperature 97.9 F (36.6 C), resp. rate 18, height 5' (1.524 m), weight 87.3 kg, last menstrual period 07/02/2023, SpO2 98%. Ab soft approp tender incisions clean  Disposition: Discharge disposition: 01-Home or Self Care        Allergies as of 07/12/2023       Reactions   Oxycodone  Swelling   Facial edema reported 07/09/23   Other    Beef Broth        Medication List     TAKE these medications    metFORMIN  500 MG tablet Commonly known as: GLUCOPHAGE  Take by mouth 2 (two) times daily with a meal.   predniSONE  20 MG tablet Commonly known as: DELTASONE  3 tabs po day one, then 2 po daily x 4 days   prenatal multivitamin Tabs tablet Take 1 tablet by mouth daily at 12 noon.   traMADol  50 MG tablet Commonly known as: ULTRAM  Take 2 tablets (100 mg total) by mouth every 6 (six) hours as needed for moderate pain (pain score 4-6).   Vitamin D3 125 MCG (5000 UT) Tabs Take 1 tablet by mouth daily.   Zepbound 7.5 MG/0.5ML Pen Generic drug: tirzepatide Inject into the skin.        Follow-up Information     Maczis, Puja Gosai, PA-C. Go on 08/01/2023.   Specialty: General Surgery Why: 8:45 AM, please arrive 30 min prior to appointment time to check in. Contact information: [address] [place] [address] [facility] [place] [address] [phone]                  Signed: Enid Harry 07/12/2023, 8:41 AM

## 2023-07-13 LAB — SURGICAL PATHOLOGY

## 2023-11-23 ENCOUNTER — Other Ambulatory Visit: Payer: Self-pay

## 2023-11-23 ENCOUNTER — Emergency Department (HOSPITAL_BASED_OUTPATIENT_CLINIC_OR_DEPARTMENT_OTHER)
Admission: EM | Admit: 2023-11-23 | Discharge: 2023-11-23 | Disposition: A | Discharge status: Home / Self Care | Attending: Emergency Medicine | Admitting: Emergency Medicine

## 2023-11-23 ENCOUNTER — Encounter (HOSPITAL_BASED_OUTPATIENT_CLINIC_OR_DEPARTMENT_OTHER): Payer: Self-pay | Admitting: Emergency Medicine

## 2023-11-23 DIAGNOSIS — I10 Essential (primary) hypertension: Secondary | ICD-10-CM | POA: Diagnosis not present

## 2023-11-23 DIAGNOSIS — O209 Hemorrhage in early pregnancy, unspecified: Secondary | ICD-10-CM | POA: Diagnosis present

## 2023-11-23 DIAGNOSIS — R102 Pelvic and perineal pain: Secondary | ICD-10-CM | POA: Insufficient documentation

## 2023-11-23 DIAGNOSIS — Z3A01 Less than 8 weeks gestation of pregnancy: Secondary | ICD-10-CM | POA: Insufficient documentation

## 2023-11-23 DIAGNOSIS — O4690 Antepartum hemorrhage, unspecified, unspecified trimester: Secondary | ICD-10-CM

## 2023-11-23 LAB — URINALYSIS, ROUTINE W REFLEX MICROSCOPIC
Bilirubin Urine: NEGATIVE
Glucose, UA: NEGATIVE mg/dL
Ketones, ur: NEGATIVE mg/dL
Nitrite: NEGATIVE
Protein, ur: NEGATIVE mg/dL
Specific Gravity, Urine: 1.015 (ref 1.005–1.030)
pH: 7 (ref 5.0–8.0)

## 2023-11-23 LAB — COMPREHENSIVE METABOLIC PANEL WITH GFR
ALT: 10 U/L (ref 0–44)
AST: 18 U/L (ref 15–41)
Albumin: 3.9 g/dL (ref 3.5–5.0)
Alkaline Phosphatase: 58 U/L (ref 38–126)
Anion gap: 12 (ref 5–15)
BUN: 6 mg/dL (ref 6–20)
CO2: 22 mmol/L (ref 22–32)
Calcium: 9.1 mg/dL (ref 8.9–10.3)
Chloride: 106 mmol/L (ref 98–111)
Creatinine, Ser: 0.55 mg/dL (ref 0.44–1.00)
GFR, Estimated: >60 mL/min (ref >60)
Glucose, Bld: 96 mg/dL (ref 70–99)
Potassium: 3.7 mmol/L (ref 3.5–5.1)
Sodium: 139 mmol/L (ref 135–145)
Total Bilirubin: 0.2 mg/dL (ref 0.0–1.2)
Total Protein: 6.4 g/dL — ABNORMAL LOW (ref 6.5–8.1)

## 2023-11-23 LAB — CBC
HCT: 39.9 % (ref 36.0–46.0)
Hemoglobin: 13.7 g/dL (ref 12.0–15.0)
MCH: 30.4 pg (ref 26.0–34.0)
MCHC: 34.3 g/dL (ref 30.0–36.0)
MCV: 88.7 fL (ref 80.0–100.0)
Platelets: 285 10*3/uL (ref 150–400)
RBC: 4.5 MIL/uL (ref 3.87–5.11)
RDW: 13.1 % (ref 11.5–15.5)
WBC: 11.3 10*3/uL — ABNORMAL HIGH (ref 4.0–10.5)
nRBC: 0 % (ref 0.0–0.2)

## 2023-11-23 LAB — URINALYSIS, MICROSCOPIC (REFLEX)

## 2023-11-23 LAB — HCG, QUANTITATIVE, PREGNANCY: hCG, Beta Chain, Quant, S: 20907 m[IU]/mL — ABNORMAL HIGH (ref <5)

## 2023-11-23 LAB — PREGNANCY, URINE: Preg Test, Ur: POSITIVE — AB

## 2023-11-23 MED ORDER — CEFADROXIL 500 MG PO CAPS: 500.0000 mg | ORAL_CAPSULE | Freq: Two times a day (BID) | ORAL | 0 refills | Status: DC

## 2023-11-23 NOTE — ED Triage Notes (Signed)
 Vaginal bleeding and one small clot today an hour ago , reports [redacted] weeks pregnant . Abd cramping that started today as well .

## 2023-11-23 NOTE — Discharge Instructions (Signed)
 Your urine did appear infected so we will begin antibiotics.  Go straight to your OB/GYN's office for further assessment.  Please do not hesitate to return if the worrisome signs and symptoms we discussed become apparent.

## 2023-11-23 NOTE — ED Provider Notes (Signed)
 [place] EMERGENCY DEPARTMENT AT [facility] [place] Provider Note   CSN: [id number] Arrival date & time: 11/23/23  1331     History  Chief Complaint  Patient presents with  . Vaginal Bleeding    Katelyn Bonilla is a 33 y.o. female.   Vaginal Bleeding   33 year old female presents emergency department complaints of pelvic pain, vaginal bleeding.  States that she is around [redacted] weeks pregnant.  Patient's G3 P1.  States she follows with a OB/GYN in [place] and just recently had a an ultrasound a day or 2 ago that showed IUP.  States that her symptoms began around 12-1 o'clock this afternoon.  States that she feels like she passed a couple clots.  Denies any fevers, chills, nausea, vomit, urinary symptoms, change in bowel habits.  Past medical history significant for hypertension, obesity, gestational diabetes, P ROM, cholecystitis with cholecystectomy  Home Medications Prior to Admission medications   Medication Sig Start Date End Date Taking? Authorizing Provider  Cholecalciferol (VITAMIN D3) 125 MCG (5000 UT) TABS Take 1 tablet by mouth daily.    [provider]  metFORMIN  (GLUCOPHAGE ) 500 MG tablet Take by mouth 2 (two) times daily with a meal.    [provider]  predniSONE  (DELTASONE ) 20 MG tablet 3 tabs po day one, then 2 po daily x 4 days Patient not taking: Reported on 07/10/2023 07/10/23   Palumbo, April, MD  Prenatal Vit-Fe Fumarate-FA (PRENATAL MULTIVITAMIN) TABS tablet Take 1 tablet by mouth daily at 12 noon.    [provider]  traMADol  (ULTRAM ) 50 MG tablet Take 2 tablets (100 mg total) by mouth every 6 (six) hours as needed for moderate pain (pain score 4-6). 07/12/23   Enid Harry, MD  ZEPBOUND 7.5 MG/0.5ML Pen Inject into the skin. Patient not taking: Reported on 07/10/2023 06/25/23   [provider]      Allergies    Oxycodone  and Other    Review of Systems   Review of Systems  Genitourinary:  Positive for vaginal  bleeding.  All other systems reviewed and are negative.   Physical Exam Updated Vital Signs BP 126/74   Pulse 80   Temp 98.2 F (36.8 C) (Oral)   Resp 16   Wt 84.8 kg   LMP 07/02/2023 Comment: negative pregnancy test on 07/11/23  SpO2 100%   BMI 36.52 kg/m  Physical Exam Vitals and nursing note reviewed.  Constitutional:      General: She is not in acute distress.    Appearance: She is well-developed.  HENT:     Head: Normocephalic and atraumatic.  Eyes:     Conjunctiva/sclera: Conjunctivae normal.  Cardiovascular:     Rate and Rhythm: Normal rate and regular rhythm.     Heart sounds: No murmur heard. Pulmonary:     Effort: Pulmonary effort is normal. No respiratory distress.     Breath sounds: Normal breath sounds. No wheezing, rhonchi or rales.  Abdominal:     Palpations: Abdomen is soft.     Tenderness: There is no abdominal tenderness.  Musculoskeletal:        General: No swelling.     Cervical back: Neck supple.  Skin:    General: Skin is warm and dry.     Capillary Refill: Capillary refill takes less than 2 seconds.  Neurological:     Mental Status: She is alert.  Psychiatric:        Mood and Affect: Mood normal.     ED Results / Procedures /  Treatments   Labs (all labs ordered are listed, but only abnormal results are displayed) Labs Reviewed  PREGNANCY, URINE - Abnormal; Notable for the following components:      Result Value   Preg Test, Ur POSITIVE (*)    All other components within normal limits  COMPREHENSIVE METABOLIC PANEL WITH GFR  CBC  URINALYSIS, ROUTINE W REFLEX MICROSCOPIC  HCG, QUANTITATIVE, PREGNANCY    EKG None  Radiology No results found.  Procedures Procedures    Medications Ordered in ED Medications - No data to display  ED Course/ Medical Decision Making/ A&P Clinical Course as of 11/23/23 1617  Thu Nov 23, 2023  1614 Awaiting for pelvic ultrasound, patient got a phone call from her OB/GYN.  OB/GYN would like to  see patient in clinic for assessment and further workup regarding ultrasound.  Patient's labs have resulted besides quantitative hCG which did show UA concerning for UTI.  Will discharge and instruct patient to go straight to OB/GYN's office for further workup. [CR]    Clinical Course User Index [CR] [place]ek[place]ha Butter, PA                                 Medical Decision Making Amount and/or Complexity of Data Reviewed Labs: ordered. Radiology: ordered.  Risk Prescription drug management.   This patient presents to the ED for concern of abdominal pain, this involves an extensive number of treatment options, and is a complaint that carries with it a high risk of complications and morbidity.  The differential diagnosis includes ectopic pregnancy, tubo-ovarian abscess, miscarriage, appendicitis, nephrolithiasis, pyelonephritis, diverticulitis, SBO/LBO, volvulus, other     Co morbidities that complicate the patient evaluation   See HPI     Additional history obtained:   Additional history obtained from EMR External records from outside source obtained and reviewed including hospital records     Lab Tests:   I Ordered, and personally interpreted labs.  The pertinent results include: Leukocytosis 11.3.  No evidence of anemia.  Platelets within range.  Nausea abnormalities.  No transaminitis.  No renal dysfunction.  UA with rare bacteria, 21-50 RBCs, trace leukocytes.  Urine pregnancy negative.  hCG elevated of 20,900.     Imaging Studies ordered:   I ordered imaging studies including pelvic ultrasound which patient declined     Cardiac Monitoring: / EKG:   The patient was maintained on a cardiac monitor.  I personally viewed and interpreted the cardiac monitored which showed an underlying rhythm of: Sinus rhythm     Consultations Obtained:   N/a     Problem List / ED Course / Critical interventions / Medication management   Vaginal bleeding, positive pregnancy  test Reevaluation of the patient showed that the patient stayed the same I have reviewed the patients home medicines and have made adjustments as needed     Social Determinants of Health:   Denies tobacco, licit drug use.     Test / Admission - Considered:   Vaginal bleeding, positive pregnancy test Vitals signs within normal range and stable throughout visit. Laboratory/imaging studies significant for: See above 33 year old female presents emergency department complaints of pelvic pain, vaginal bleeding.  States that she is around [redacted] weeks pregnant.  Patient's G3 P1.  States she follows with a OB/GYN in [place] and just recently had a an ultrasound a day or 2 ago that showed IUP.  States that her symptoms began around 12-1 o'clock  this afternoon.  States that she feels like she passed a couple clots.  Denies any fevers, chills, nausea, vomit, urinary symptoms, change in bowel habits. On exam, slight tenderness suprapubic region otherwise, benign physical exam.  Labs with normal hemoglobin, positive pregnancy test with hCG 20,900.  UA with concerns for infection with bacteria, RBCs and leukocytes present.  While awaiting for pelvic ultrasound, patient got a phone call/message from her OB/GYN who wanted to see the patient in office in 20 to 30 minutes.  Patient requested to leave prior to ultrasound being performed as OB/GYN would like to assess her and perform an ultrasound in the office. This seemed reasonable.  Will send antibiotics for treatment of UTI and discharge patient with instructions to go immediately to OB/GYN's office.  Patient stable upon discharge.         Final Clinical Impression(s) / ED Diagnoses Final diagnoses:  None    Rx / DC Orders ED Discharge Orders     None         [place] Butter, [place] 11/23/23 1617    Albertus Hughs, DO 11/24/23 [phone]

## 2023-12-21 DIAGNOSIS — O10919 Unspecified pre-existing hypertension complicating pregnancy, unspecified trimester: Secondary | ICD-10-CM | POA: Insufficient documentation

## 2023-12-21 DIAGNOSIS — Z8632 Personal history of gestational diabetes: Secondary | ICD-10-CM | POA: Insufficient documentation

## 2023-12-21 DIAGNOSIS — E669 Obesity, unspecified: Secondary | ICD-10-CM | POA: Insufficient documentation

## 2023-12-21 DIAGNOSIS — Z8759 Personal history of other complications of pregnancy, childbirth and the puerperium: Secondary | ICD-10-CM | POA: Insufficient documentation

## 2023-12-21 LAB — OB RESULTS CONSOLE HEPATITIS B SURFACE ANTIGEN: Hepatitis B Surface Ag: NEGATIVE

## 2023-12-21 LAB — OB RESULTS CONSOLE RUBELLA ANTIBODY, IGM: Rubella: NON-IMMUNE/NOT IMMUNE

## 2023-12-21 LAB — OB RESULTS CONSOLE HIV ANTIBODY (ROUTINE TESTING): HIV: NONREACTIVE

## 2023-12-21 LAB — OB RESULTS CONSOLE RPR: RPR: NONREACTIVE

## 2023-12-26 DIAGNOSIS — Z789 Other specified health status: Secondary | ICD-10-CM | POA: Insufficient documentation

## 2023-12-26 DIAGNOSIS — Z6791 Unspecified blood type, Rh negative: Secondary | ICD-10-CM | POA: Insufficient documentation

## 2024-01-11 ENCOUNTER — Other Ambulatory Visit: Payer: Self-pay | Admitting: Obstetrics and Gynecology

## 2024-01-11 DIAGNOSIS — Z789 Other specified health status: Secondary | ICD-10-CM

## 2024-01-24 ENCOUNTER — Other Ambulatory Visit: Payer: Self-pay

## 2024-01-24 ENCOUNTER — Encounter (HOSPITAL_BASED_OUTPATIENT_CLINIC_OR_DEPARTMENT_OTHER): Payer: Self-pay | Admitting: Radiology

## 2024-01-24 ENCOUNTER — Emergency Department (HOSPITAL_BASED_OUTPATIENT_CLINIC_OR_DEPARTMENT_OTHER)
Admission: EM | Admit: 2024-01-24 | Discharge: 2024-01-24 | Disposition: A | Discharge status: Home / Self Care | Attending: Emergency Medicine | Admitting: Emergency Medicine

## 2024-01-24 DIAGNOSIS — R519 Headache, unspecified: Secondary | ICD-10-CM | POA: Diagnosis not present

## 2024-01-24 DIAGNOSIS — Z3A14 14 weeks gestation of pregnancy: Secondary | ICD-10-CM | POA: Insufficient documentation

## 2024-01-24 DIAGNOSIS — O99411 Diseases of the circulatory system complicating pregnancy, first trimester: Secondary | ICD-10-CM | POA: Diagnosis not present

## 2024-01-24 DIAGNOSIS — O132 Gestational [pregnancy-induced] hypertension without significant proteinuria, second trimester: Secondary | ICD-10-CM | POA: Insufficient documentation

## 2024-01-24 DIAGNOSIS — R030 Elevated blood-pressure reading, without diagnosis of hypertension: Secondary | ICD-10-CM

## 2024-01-24 LAB — CBC WITH DIFFERENTIAL/PLATELET
Abs Immature Granulocytes: 0.09 K/uL — ABNORMAL HIGH (ref 0.00–0.07)
Basophils Absolute: 0 K/uL (ref 0.0–0.1)
Basophils Relative: 0 %
Eosinophils Absolute: 0.2 K/uL (ref 0.0–0.5)
Eosinophils Relative: 1 %
HCT: 37.2 % (ref 36.0–46.0)
Hemoglobin: 12.9 g/dL (ref 12.0–15.0)
Immature Granulocytes: 1 %
Lymphocytes Relative: 17 %
Lymphs Abs: 2.9 K/uL (ref 0.7–4.0)
MCH: 30.4 pg (ref 26.0–34.0)
MCHC: 34.7 g/dL (ref 30.0–36.0)
MCV: 87.7 fL (ref 80.0–100.0)
Monocytes Absolute: 1.2 K/uL — ABNORMAL HIGH (ref 0.1–1.0)
Monocytes Relative: 7 %
Neutro Abs: 12.7 K/uL — ABNORMAL HIGH (ref 1.7–7.7)
Neutrophils Relative %: 74 %
Platelets: 298 K/uL (ref 150–400)
RBC: 4.24 MIL/uL (ref 3.87–5.11)
RDW: 13.3 % (ref 11.5–15.5)
WBC: 17.2 K/uL — ABNORMAL HIGH (ref 4.0–10.5)
nRBC: 0 % (ref 0.0–0.2)

## 2024-01-24 LAB — URINALYSIS, ROUTINE W REFLEX MICROSCOPIC
Bilirubin Urine: NEGATIVE
Glucose, UA: NEGATIVE mg/dL
Hgb urine dipstick: NEGATIVE
Ketones, ur: NEGATIVE mg/dL
Leukocytes,Ua: NEGATIVE
Nitrite: NEGATIVE
Protein, ur: NEGATIVE mg/dL
Specific Gravity, Urine: 1.015 (ref 1.005–1.030)
pH: 7 (ref 5.0–8.0)

## 2024-01-24 LAB — COMPREHENSIVE METABOLIC PANEL WITH GFR
ALT: 12 U/L (ref 0–44)
AST: 15 U/L (ref 15–41)
Albumin: 3.9 g/dL (ref 3.5–5.0)
Alkaline Phosphatase: 59 U/L (ref 38–126)
Anion gap: 13 (ref 5–15)
BUN: 8 mg/dL (ref 6–20)
CO2: 22 mmol/L (ref 22–32)
Calcium: 9 mg/dL (ref 8.9–10.3)
Chloride: 101 mmol/L (ref 98–111)
Creatinine, Ser: 0.37 mg/dL — ABNORMAL LOW (ref 0.44–1.00)
GFR, Estimated: >60 mL/min (ref >60)
Glucose, Bld: 87 mg/dL (ref 70–99)
Potassium: 3.9 mmol/L (ref 3.5–5.1)
Sodium: 136 mmol/L (ref 135–145)
Total Bilirubin: 0.2 mg/dL (ref 0.0–1.2)
Total Protein: 6.7 g/dL (ref 6.5–8.1)

## 2024-01-24 LAB — HCG, QUANTITATIVE, PREGNANCY: hCG, Beta Chain, Quant, S: 40386 m[IU]/mL — ABNORMAL HIGH (ref <5)

## 2024-01-24 MED ORDER — DIPHENHYDRAMINE HCL 50 MG/ML IJ SOLN
12.5000 mg | Freq: Once | INTRAMUSCULAR | Status: AC
  Administered 2024-01-24: 12.5 mg via INTRAVENOUS
  Filled 2024-01-24: qty 1

## 2024-01-24 MED ORDER — ACETAMINOPHEN 500 MG PO TABS
1000.0000 mg | ORAL_TABLET | Freq: Once | ORAL | Status: AC
  Administered 2024-01-24: 1000 mg via ORAL
  Filled 2024-01-24: qty 2

## 2024-01-24 MED ORDER — METOCLOPRAMIDE HCL 5 MG/ML IJ SOLN
10.0000 mg | Freq: Once | INTRAMUSCULAR | Status: AC
  Administered 2024-01-24: 10 mg via INTRAVENOUS
  Filled 2024-01-24: qty 2

## 2024-01-24 MED ORDER — SODIUM CHLORIDE 0.9 % IV BOLUS
1000.0000 mL | Freq: Once | INTRAVENOUS | Status: AC
  Administered 2024-01-24: 1000 mL via INTRAVENOUS

## 2024-01-24 NOTE — ED Triage Notes (Signed)
 Pt has a severe headache and believes her BP is elevated. Pt is 3.5 months pregnant.

## 2024-01-24 NOTE — ED Provider Notes (Signed)
 [place] EMERGENCY DEPARTMENT AT [facility] Provider Note   CSN: [id number] Arrival date & time: 01/24/24  1845     Patient presents with: Headache and Hypertension   Katelyn Bonilla is a 33 y.o. female G2, P1 approximately 3.5 months gestation with history of preeclampsia presents with complaints of headache and elevated blood pressure.  Headache started last night came on gradually.  Not associated with any dizziness, blurry vision, extremity weakness or numbness.  She is not on blood pressure medication at home.    Headache Hypertension Associated symptoms include headaches.      Past Medical History:  Diagnosis Date   Hypertension    Obesity    BMI >40   Pregnancy induced hypertension    Past Surgical History:  Procedure Laterality Date   CHOLECYSTECTOMY N/A 07/11/2023   Procedure: LAPAROSCOPIC CHOLECYSTECTOMY WITH ICG DYE;  Surgeon: Ebbie Cough, MD;  Location: [facility];  Service: General;  Laterality: N/A;     Prior to Admission medications   Medication Sig Start Date End Date Taking? Authorizing Provider  cefadroxil  (DURICEF) 500 MG capsule Take 1 capsule (500 mg total) by mouth 2 (two) times daily. 11/23/23   Silver Wonda LABOR, PA  Cholecalciferol (VITAMIN D3) 125 MCG (5000 UT) TABS Take 1 tablet by mouth daily.    [provider]  metFORMIN  (GLUCOPHAGE ) 500 MG tablet Take by mouth 2 (two) times daily with a meal.    [provider]  predniSONE  (DELTASONE ) 20 MG tablet 3 tabs po day one, then 2 po daily x 4 days Patient not taking: Reported on 07/10/2023 07/10/23   Palumbo, April, MD  Prenatal Vit-Fe Fumarate-FA (PRENATAL MULTIVITAMIN) TABS tablet Take 1 tablet by mouth daily at 12 noon.    [provider]  traMADol  (ULTRAM ) 50 MG tablet Take 2 tablets (100 mg total) by mouth every 6 (six) hours as needed for moderate pain (pain score 4-6). 07/12/23   Ebbie Cough, MD  ZEPBOUND 7.5 MG/0.5ML Pen Inject into the  skin. Patient not taking: Reported on 07/10/2023 06/25/23   [provider]    Allergies: Oxycodone  and Other    Review of Systems  Neurological:  Positive for headaches.    Updated Vital Signs BP (!) 155/97 (BP Location: Left Arm)   Pulse 91   Temp 98 F (36.7 C) (Oral)   Resp 20   Ht 5' (1.524 m)   Wt 84.8 kg   LMP 07/02/2023 Comment: negative pregnancy test on 07/11/23  SpO2 98%   BMI 36.52 kg/m   Physical Exam Vitals and nursing note reviewed.  Constitutional:      General: She is not in acute distress.    Appearance: She is well-developed.  HENT:     Head: Normocephalic and atraumatic.  Eyes:     Conjunctiva/sclera: Conjunctivae normal.  Cardiovascular:     Rate and Rhythm: Normal rate and regular rhythm.     Heart sounds: No murmur heard. Pulmonary:     Effort: Pulmonary effort is normal. No respiratory distress.     Breath sounds: Normal breath sounds.  Abdominal:     Palpations: Abdomen is soft.     Tenderness: There is no abdominal tenderness.  Musculoskeletal:        General: No swelling.     Cervical back: Neck supple.  Skin:    General: Skin is warm and dry.     Capillary Refill: Capillary refill takes less than 2 seconds.  Neurological:     Mental  Status: She is alert.     Comments: Patient is alert and oriented. There is no abnormal phonation. Symmetric smile without facial droop. Moves all extremities spontaneously. 5/5 strength in upper and lower extremities. . No sensation deficit. There is no nystagmus. EOMI, PERRL. Coordination intact with finger to nose and normal ambulation.    Psychiatric:        Mood and Affect: Mood normal.     (all labs ordered are listed, but only abnormal results are displayed) Labs Reviewed  CBC WITH DIFFERENTIAL/PLATELET - Abnormal; Notable for the following components:      Result Value   WBC 17.2 (*)    Neutro Abs 12.7 (*)    Monocytes Absolute 1.2 (*)    Abs Immature Granulocytes 0.09 (*)    All  other components within normal limits  COMPREHENSIVE METABOLIC PANEL WITH GFR - Abnormal; Notable for the following components:   Creatinine, Ser 0.37 (*)    All other components within normal limits  HCG, QUANTITATIVE, PREGNANCY - Abnormal; Notable for the following components:   hCG, Beta Chain, Quant, S 40,386 (*)    All other components within normal limits  URINALYSIS, ROUTINE W REFLEX MICROSCOPIC  PREGNANCY, URINE    EKG: None  Radiology: No results found.   Procedures   Medications Ordered in the ED  sodium chloride  0.9 % bolus 1,000 mL (0 mLs Intravenous Stopped 01/24/24 2105)  metoCLOPramide  (REGLAN ) injection 10 mg (10 mg Intravenous Given 01/24/24 1951)  acetaminophen  (TYLENOL ) tablet 1,000 mg (1,000 mg Oral Given 01/24/24 1948)  diphenhydrAMINE  (BENADRYL ) injection 12.5 mg (12.5 mg Intravenous Given 01/24/24 1950)                                    Medical Decision Making Amount and/or Complexity of Data Reviewed Labs: ordered.  Risk OTC drugs. Prescription drug management.   This patient presents to the ED with chief complaint(s) of headache.  The complaint involves an extensive differential diagnosis and also carries with it a high risk of complications and morbidity.   Pertinent past medical history as listed in HPI  The differential diagnosis includes  Subarachnoid hemorrhage, CVA, TIA, migraine/tension like headache Additional history obtained: Records reviewed Care Everywhere/External Records  Assessment and management:   Patient presents hypertensive 155/97 with complaints of headache and elevated blood pressure.  She is concerned given history of preeclampsia.  She is not on blood pressure medication at home.  Recheck is 130/80.  Her headache was not sudden or maximal in onset.  It is not worse of life time.  She has no neurodeficits on exam.  She has no nuchal rigidity.  She was able to ambulate.  She is not vomiting.  Will administer a migraine  cocktail and obtain routine labs.  Upon reassessment patient reports marked improvement of her symptoms.  She does have a leukocytosis without any infection symptoms.  Remainder of her lab work is without any significant abnormality.  She is amenable to discharge with follow-up with her OB.  Independent ECG interpretation:  none  Independent labs interpretation:  The following labs were independently interpreted:  CBC with leukocytosis of 17.2, CMP without significant abnormality, UA unremarkable  Independent visualization and interpretation of imaging: I independently visualized the following imaging with scope of interpretation limited to determining acute life threatening conditions related to emergency care: none    Consultations obtained:   none  Disposition:  Patient will be discharged home. The patient has been appropriately medically screened and/or stabilized in the ED. I have low suspicion for any other emergent medical condition which would require further screening, evaluation or treatment in the ED or require inpatient management. At time of discharge the patient is hemodynamically stable and in no acute distress. I have discussed work-up results and diagnosis with patient and answered all questions. Patient is agreeable with discharge plan. We discussed strict return precautions for returning to the emergency department and they verbalized understanding.     Social Determinants of Health:   none  This note was dictated with voice recognition software.  Despite best efforts at proofreading, errors may have occurred which can change the documentation meaning.       Final diagnoses:  Bad headache  Elevated blood pressure reading    ED Discharge Orders     None          Donnajean Lynwood VEAR DEVONNA 01/24/24 2143    Towana Ozell BROCKS, MD 01/25/24 [phone]

## 2024-01-24 NOTE — Discharge Instructions (Signed)
 You were evaluated in the emergency room for headache and elevated blood pressure.  Your lab work did not show any significant abnormality.  Please follow-up with your primary care doctor/OB if your symptoms persist.

## 2024-03-15 ENCOUNTER — Ambulatory Visit (HOSPITAL_BASED_OUTPATIENT_CLINIC_OR_DEPARTMENT_OTHER)

## 2024-03-15 ENCOUNTER — Ambulatory Visit: Attending: Obstetrics and Gynecology | Admitting: Maternal & Fetal Medicine

## 2024-03-15 ENCOUNTER — Other Ambulatory Visit: Payer: Self-pay | Admitting: *Deleted

## 2024-03-15 VITALS — BP 143/76 | HR 93

## 2024-03-15 DIAGNOSIS — O09819 Supervision of pregnancy resulting from assisted reproductive technology, unspecified trimester: Secondary | ICD-10-CM | POA: Diagnosis present

## 2024-03-15 DIAGNOSIS — Z789 Other specified health status: Secondary | ICD-10-CM

## 2024-03-15 DIAGNOSIS — O09292 Supervision of pregnancy with other poor reproductive or obstetric history, second trimester: Secondary | ICD-10-CM | POA: Insufficient documentation

## 2024-03-15 DIAGNOSIS — Z7952 Long term (current) use of systemic steroids: Secondary | ICD-10-CM | POA: Diagnosis not present

## 2024-03-15 DIAGNOSIS — Z362 Encounter for other antenatal screening follow-up: Secondary | ICD-10-CM

## 2024-03-15 DIAGNOSIS — Z7984 Long term (current) use of oral hypoglycemic drugs: Secondary | ICD-10-CM | POA: Insufficient documentation

## 2024-03-15 DIAGNOSIS — Z7982 Long term (current) use of aspirin: Secondary | ICD-10-CM | POA: Insufficient documentation

## 2024-03-15 DIAGNOSIS — O10012 Pre-existing essential hypertension complicating pregnancy, second trimester: Secondary | ICD-10-CM

## 2024-03-15 DIAGNOSIS — O10912 Unspecified pre-existing hypertension complicating pregnancy, second trimester: Secondary | ICD-10-CM

## 2024-03-15 DIAGNOSIS — O99212 Obesity complicating pregnancy, second trimester: Secondary | ICD-10-CM

## 2024-03-15 DIAGNOSIS — E282 Polycystic ovarian syndrome: Secondary | ICD-10-CM | POA: Insufficient documentation

## 2024-03-15 DIAGNOSIS — Z3A22 22 weeks gestation of pregnancy: Secondary | ICD-10-CM | POA: Diagnosis not present

## 2024-03-15 DIAGNOSIS — Z8759 Personal history of other complications of pregnancy, childbirth and the puerperium: Secondary | ICD-10-CM

## 2024-03-15 DIAGNOSIS — O09299 Supervision of pregnancy with other poor reproductive or obstetric history, unspecified trimester: Secondary | ICD-10-CM

## 2024-03-15 DIAGNOSIS — O09212 Supervision of pregnancy with history of pre-term labor, second trimester: Secondary | ICD-10-CM | POA: Diagnosis not present

## 2024-03-15 DIAGNOSIS — O09899 Supervision of other high risk pregnancies, unspecified trimester: Secondary | ICD-10-CM

## 2024-03-15 DIAGNOSIS — O09812 Supervision of pregnancy resulting from assisted reproductive technology, second trimester: Secondary | ICD-10-CM

## 2024-03-15 DIAGNOSIS — Z363 Encounter for antenatal screening for malformations: Secondary | ICD-10-CM | POA: Diagnosis not present

## 2024-03-15 DIAGNOSIS — E669 Obesity, unspecified: Secondary | ICD-10-CM

## 2024-03-15 DIAGNOSIS — Z8632 Personal history of gestational diabetes: Secondary | ICD-10-CM

## 2024-03-15 DIAGNOSIS — Z3689 Encounter for other specified antenatal screening: Secondary | ICD-10-CM | POA: Diagnosis not present

## 2024-03-15 NOTE — Progress Notes (Signed)
 Patient information  Patient Name: KINZLY PIERRELOUIS  Patient MRN:   [id number]  Referring practice: [facility] Referring Provider: [facility]  Problem List   Patient Active Problem List   Diagnosis Date Noted   Pregnancy resulting from in vitro fertilization in second trimester 03/15/2024   Not immune to rubella 12/26/2023   RhD negative 12/26/2023   History of gestational diabetes mellitus (GDM) 12/21/2023   History of preterm premature rupture of membranes (PPROM) 12/21/2023   History of pre-eclampsia 12/21/2023   Maternal chronic hypertension 12/21/2023   Obesity 12/21/2023   Previous baby delivered by vacuum extraction 12/21/2023   Mixed hyperlipidemia 08/04/2019   Vitamin D deficiency 08/04/2019    Maternal Fetal Medicine Consult TNIYA BOWDITCH is a 33 y.o. [id number] at [redacted]w[redacted]d here for ultrasound and consultation. She had low risk aneuploidy screening of a female fetus. She has no acute concerns.   Today we focused on the following:   IVF pregnancy: The patient reports that she had PCOS and her husband had a low sperm count so they have done IVF on the last 2 pregnancies.  Her very first pregnancy was achieved spontaneously in 2012 and resulted in a miscarriage.  We discussed the potential complications and management for an IVF pregnancy.  I offered a fetal echo due to the slight increased risk of congenital heart disease associated with IVF and the patient has elected to pursue this.  She did not have PGT testing but did aneuploidy screening which showed a low risk for trisomy X/Y, 18, 13 and 21.  History of gestational diabetes: Patient reports that she was more overweight at that time and has now been a healthier weight and watches her diet more closely.  I discussed the importance of dietary discretion and to monitor carbohydrate and sugar intake.  History of preeclampsia: The patient reports that in her second pregnancy she developed preeclampsia.  Her water  broke and then  she shortly went into labor afterwards spontaneously.  She is compliant with aspirin and feels well educated about the signs and symptoms to monitor for during this pregnancy.  If not previously completed she should have baseline preeclampsia labs.  History of PPROM: This occurred at 35 weeks in her previous pregnancy.  I discussed that this is a risk factor for preterm birth and there is a slightly higher chance of recurrence but it is not guaranteed.  We also discussed that there is no reliable prevention method for reducing the risk of recurrent preterm prelabor rupture of membranes.  We discussed historically that intramuscular progesterone was used to reduce the risk of preterm birth but more recent and thorough studies have not shown this association.  I discussed that vaginal progesterone can be considered as an alternative but is not recommended at this time but is reasonable given its safety profile  Chronic hypertension: The patient has a history of chronic hypertension but does not take any antihypertensive medication during pregnancy.  Today her blood pressure is slightly elevated initially but repeat was normal at 127/72.  If her blood pressures consistently over 140/90 then antihypertensive medication should be considered with Procardia  typically being first-line.  Sonographic findings Single intrauterine pregnancy at 22w 1d  Fetal cardiac activity:  Observed and appears normal. Presentation: Variable. The anatomic structures that were well seen appear normal without evidence of soft markers. Due to poor acoustic windows some structures remain suboptimally visualized. Fetal biometry shows the estimated fetal weight at the 74 percentile.  Amniotic fluid: Subjectively upper-normal.  MVP: 9.17 cm. Placenta: Anterior. Adnexa: No abnormality visualized. Cervical length: 3.6 cm.  There are limitations of prenatal ultrasound such as the inability to detect certain abnormalities due to poor  visualization. Various factors such as fetal position, gestational age and maternal body habitus may increase the difficulty in visualizing the fetal anatomy.    Recommendations - EDD should be 07/18/2024 based on  Embryo Transfer  (10/31/23). - Anatomy ultrasound was done today with the above findings (see report). - Aspirin 81-162 mg continued throughout the pregnancy for preeclampsia prophylaxis. - Baseline labs: CMP, CBC, urine protein creatinine ratio if not previously completed. Repeat with any concerns for preeclampsia. - Blood pressure goal of < 140 systolic and < 90 diastolic. Antihypertensive medication should be added/adjusted until BP goal is achieved.  - Early Glucola screening (or A1c of patient declines). - Baseline EKG if HTN has been more than 5 years in duration.  - Maternal Echocardiogram if EKG is abnormal or patient develops concerning symptoms of heart failure. - Fetal echocardiogram due to increased risk of congenital heart defects in this patient population. - Serial growth ultrasounds every 4 weeks starting at 24 to 28 weeks until delivery. - Antenatal testing (usually weekly BPP or NST) weekly at 32 weeks until delivery. - Delivery likely around 39-[redacted] weeks gestation or sooner if indicated.  Review of Systems: A review of systems was performed and was negative except per HPI   Past Obstetrical History:  OB History  Gravida Para Term Preterm AB Living  3 1  1 1 1   SAB IAB Ectopic Multiple Live Births  1   0 1    # Outcome Date GA Lbr Len/2nd Weight Sex Type Anes PTL Lv  3 Current           2 Preterm 06/12/20 [redacted]w[redacted]d 11:45 / 01:19 6 lb 5.2 oz (2.87 kg) F Vag-Vacuum EPI  LIV     Birth Comments: WDL  1 SAB              Past Medical History:  Past Medical History:  Diagnosis Date   Acute cholecystitis due to biliary calculus 07/10/2023   Chronic hypertension with superimposed preeclampsia 05/30/2020   Gestational diabetes mellitus (GDM), antepartum 04/15/2020    Hypertension    Obesity    BMI >40   Pregnancy induced hypertension    Preterm premature rupture of membranes (PPROM) with unknown onset of labor 06/11/2020     Past Surgical History:    Past Surgical History:  Procedure Laterality Date   CHOLECYSTECTOMY N/A 07/11/2023   Procedure: LAPAROSCOPIC CHOLECYSTECTOMY WITH ICG DYE;  Surgeon: Ebbie Cough, MD;  Location: [facility];  Service: General;  Laterality: N/A;     Home Medications:   Current Outpatient Medications on File Prior to Visit  Medication Sig Dispense Refill   aspirin EC 81 MG tablet Take 81 mg by mouth daily. Swallow whole.     Cholecalciferol (VITAMIN D3) 125 MCG (5000 UT) TABS Take 1 tablet by mouth daily.     Famotidine  (PEPCID  PO) Take by mouth.     Prenatal Vit-Fe Fumarate-FA (PRENATAL MULTIVITAMIN) TABS tablet Take 1 tablet by mouth daily at 12 noon.     cefadroxil  (DURICEF) 500 MG capsule Take 1 capsule (500 mg total) by mouth 2 (two) times daily. (Patient not taking: Reported on 03/15/2024) 10 capsule 0   metFORMIN  (GLUCOPHAGE ) 500 MG tablet Take by mouth 2 (two) times daily with a meal. (Patient not taking: Reported on 03/15/2024)  predniSONE  (DELTASONE ) 20 MG tablet 3 tabs po day one, then 2 po daily x 4 days (Patient not taking: Reported on 07/10/2023) 11 tablet 0   traMADol  (ULTRAM ) 50 MG tablet Take 2 tablets (100 mg total) by mouth every 6 (six) hours as needed for moderate pain (pain score 4-6). (Patient not taking: Reported on 03/15/2024) 10 tablet 0   ZEPBOUND 7.5 MG/0.5ML Pen Inject into the skin. (Patient not taking: Reported on 07/10/2023)     No current facility-administered medications on file prior to visit.      Allergies:   Allergies  Allergen Reactions   Oxycodone  Swelling    Facial edema reported 07/09/23     Physical Exam:   Vitals:   03/15/24 0712  BP: (!) 143/76  Pulse: 93   Sitting comfortably on the sonogram table Nonlabored breathing Normal rate and rhythm Abdomen is  nontender  Thank you for the opportunity to be involved with this patient's care. Please let us  know if we can be of any further assistance.   60 minutes of time was spent reviewing the patient's chart including labs, imaging and documentation.  At least 50% of this time was spent with direct patient care discussing the diagnosis, management and prognosis of her care.  Delora Smaller MFM, [facility]   03/15/2024  9:05 AM

## 2024-04-12 ENCOUNTER — Other Ambulatory Visit: Payer: Self-pay | Admitting: *Deleted

## 2024-04-12 ENCOUNTER — Ambulatory Visit: Attending: Obstetrics and Gynecology

## 2024-04-12 ENCOUNTER — Ambulatory Visit (HOSPITAL_BASED_OUTPATIENT_CLINIC_OR_DEPARTMENT_OTHER): Admitting: Obstetrics and Gynecology

## 2024-04-12 VITALS — BP 132/71 | HR 75

## 2024-04-12 DIAGNOSIS — Z3A26 26 weeks gestation of pregnancy: Secondary | ICD-10-CM | POA: Insufficient documentation

## 2024-04-12 DIAGNOSIS — Z8759 Personal history of other complications of pregnancy, childbirth and the puerperium: Secondary | ICD-10-CM | POA: Diagnosis present

## 2024-04-12 DIAGNOSIS — E669 Obesity, unspecified: Secondary | ICD-10-CM

## 2024-04-12 DIAGNOSIS — O10012 Pre-existing essential hypertension complicating pregnancy, second trimester: Secondary | ICD-10-CM

## 2024-04-12 DIAGNOSIS — Z8632 Personal history of gestational diabetes: Secondary | ICD-10-CM | POA: Insufficient documentation

## 2024-04-12 DIAGNOSIS — O10912 Unspecified pre-existing hypertension complicating pregnancy, second trimester: Secondary | ICD-10-CM | POA: Diagnosis present

## 2024-04-12 DIAGNOSIS — O09299 Supervision of pregnancy with other poor reproductive or obstetric history, unspecified trimester: Secondary | ICD-10-CM | POA: Diagnosis present

## 2024-04-12 DIAGNOSIS — O09812 Supervision of pregnancy resulting from assisted reproductive technology, second trimester: Secondary | ICD-10-CM

## 2024-04-12 DIAGNOSIS — O09899 Supervision of other high risk pregnancies, unspecified trimester: Secondary | ICD-10-CM | POA: Insufficient documentation

## 2024-04-12 DIAGNOSIS — Z362 Encounter for other antenatal screening follow-up: Secondary | ICD-10-CM | POA: Insufficient documentation

## 2024-04-12 DIAGNOSIS — O99212 Obesity complicating pregnancy, second trimester: Secondary | ICD-10-CM | POA: Diagnosis present

## 2024-04-12 DIAGNOSIS — O09212 Supervision of pregnancy with history of pre-term labor, second trimester: Secondary | ICD-10-CM

## 2024-04-12 NOTE — Progress Notes (Signed)
 Maternal-Fetal Medicine Consultation  Name: Katelyn Bonilla  MRN: [id number]  GA: [id number] [redacted]w[redacted]d   IVF pregnancy.  Patient returned for completion of fetal anatomy.  Fetal echocardiography was reported as normal. Her previous pregnancy was complicated by gestational diabetes.  Ultrasound The estimated fetal weight and abdominal circumference measurements are at the 99th percentiles.  Normal amniotic fluid.  Fetal anatomical survey appears normal. IVF pregnancy IVF pregnancy is associated with a slight increase in congenital malformations (1.2-fold) and stillbirth (2.5-fold).   I discussed the significance of weekly antenatal testing to identify fetal compromise from [redacted] weeks gestation till delivery. We recommend delivery at [redacted] weeks gestation.  Recommendations - Recommend screening for gestational diabetes. -Fetal growth assessment in 6 weeks. - Weekly antenatal testing from [redacted] weeks gestation until delivery.     Consultation including face-to-face (more than 50%) counseling 20 minutes.

## 2024-05-27 ENCOUNTER — Other Ambulatory Visit: Payer: Self-pay | Admitting: *Deleted

## 2024-05-27 ENCOUNTER — Ambulatory Visit: Attending: Obstetrics and Gynecology | Admitting: Obstetrics

## 2024-05-27 ENCOUNTER — Ambulatory Visit

## 2024-05-27 ENCOUNTER — Other Ambulatory Visit: Payer: Self-pay | Admitting: Obstetrics and Gynecology

## 2024-05-27 VITALS — BP 148/80 | HR 72

## 2024-05-27 DIAGNOSIS — O10913 Unspecified pre-existing hypertension complicating pregnancy, third trimester: Secondary | ICD-10-CM

## 2024-05-27 DIAGNOSIS — O09812 Supervision of pregnancy resulting from assisted reproductive technology, second trimester: Secondary | ICD-10-CM

## 2024-05-27 DIAGNOSIS — O10013 Pre-existing essential hypertension complicating pregnancy, third trimester: Secondary | ICD-10-CM

## 2024-05-27 DIAGNOSIS — O10919 Unspecified pre-existing hypertension complicating pregnancy, unspecified trimester: Secondary | ICD-10-CM

## 2024-05-27 DIAGNOSIS — O99213 Obesity complicating pregnancy, third trimester: Secondary | ICD-10-CM | POA: Diagnosis not present

## 2024-05-27 DIAGNOSIS — Z8759 Personal history of other complications of pregnancy, childbirth and the puerperium: Secondary | ICD-10-CM

## 2024-05-27 DIAGNOSIS — O09813 Supervision of pregnancy resulting from assisted reproductive technology, third trimester: Secondary | ICD-10-CM | POA: Diagnosis present

## 2024-05-27 DIAGNOSIS — E669 Obesity, unspecified: Secondary | ICD-10-CM

## 2024-05-27 DIAGNOSIS — O24410 Gestational diabetes mellitus in pregnancy, diet controlled: Secondary | ICD-10-CM | POA: Insufficient documentation

## 2024-05-27 DIAGNOSIS — Z3A32 32 weeks gestation of pregnancy: Secondary | ICD-10-CM

## 2024-05-27 NOTE — Progress Notes (Signed)
 MFM Consult Note  Katelyn Bonilla is currently at [redacted]w[redacted]d. She has been followed due to an IVF pregnancy and chronic hypertension that is not treated with any medications.    Her blood pressures today were 148/80 and 162/94.  She was recently diagnosed with diet-controlled gestational diabetes.  She reports that her fingerstick values have mostly been within normal limits.  Sonographic findings Single intrauterine pregnancy at 32w 4d. Fetal cardiac activity: Observed. Presentation: Cephalic. Interval fetal anatomy appears normal. Fetal biometry shows the estimated fetal weight of 5 lb 9 oz,  2513g (95%). Amniotic fluid: Within normal limits. AFI: 12.8 cm.  MVP: 4.67 cm. Placenta: Anterior. BPP: 8/8.   Gestational Diabetes and chronic hypertension  She was advised to continue to monitor her fingersticks 4 times daily (fasting and 2 hours after each meal).    She was advised that our goals for her fingerstick values are fasting values of 90-95 or less and two-hour postprandial values of 120 or less.    Should the majority (greater than 50%) of her fingerstick results be above these values, she may have to be started on insulin or metformin  to help her achieve better glycemic control.   Due to gestational diabetes, we will continue to follow her with monthly growth ultrasounds.    She should continue weekly fetal testing in your office and continue the weekly tests until delivery.  The patient was advised that delivery for well-controlled diabetes in pregnancy is usually recommended at around 39 weeks.    However, delivery at 37 weeks may be considered should her blood pressures be elevated or should her glycemic control be poor.  A follow up exam was scheduled in 4 weeks.   The patient stated that all of her questions were answered.   A total of 20 minutes was spent counseling and coordinating the care for this patient.  Greater than 50% of the time was spent in direct face-to-face  contact.

## 2024-05-29 ENCOUNTER — Encounter (HOSPITAL_COMMUNITY): Payer: Self-pay | Admitting: Obstetrics and Gynecology

## 2024-05-29 ENCOUNTER — Inpatient Hospital Stay (HOSPITAL_COMMUNITY)
Admission: AD | Admit: 2024-05-29 | Discharge: 2024-05-29 | Disposition: A | Discharge status: Home / Self Care | Attending: Obstetrics and Gynecology | Admitting: Obstetrics and Gynecology

## 2024-05-29 DIAGNOSIS — Z3A33 33 weeks gestation of pregnancy: Secondary | ICD-10-CM | POA: Diagnosis not present

## 2024-05-29 DIAGNOSIS — O26899 Other specified pregnancy related conditions, unspecified trimester: Secondary | ICD-10-CM

## 2024-05-29 DIAGNOSIS — O113 Pre-existing hypertension with pre-eclampsia, third trimester: Secondary | ICD-10-CM | POA: Diagnosis not present

## 2024-05-29 LAB — URINALYSIS, ROUTINE W REFLEX MICROSCOPIC
Bilirubin Urine: NEGATIVE
Glucose, UA: NEGATIVE mg/dL
Hgb urine dipstick: NEGATIVE
Ketones, ur: NEGATIVE mg/dL
Leukocytes,Ua: NEGATIVE
Nitrite: NEGATIVE
Protein, ur: 100 mg/dL — AB
Specific Gravity, Urine: 1.003 — ABNORMAL LOW (ref 1.005–1.030)
pH: 6 (ref 5.0–8.0)

## 2024-05-29 MED ORDER — ACETAMINOPHEN 500 MG PO TABS
1000.0000 mg | ORAL_TABLET | Freq: Once | ORAL | Status: AC
  Administered 2024-05-29: 1000 mg via ORAL
  Filled 2024-05-29: qty 2

## 2024-05-29 MED ORDER — CYCLOBENZAPRINE HCL 5 MG PO TABS
5.0000 mg | ORAL_TABLET | ORAL | Status: AC
  Administered 2024-05-29: 5 mg via ORAL
  Filled 2024-05-29: qty 1

## 2024-05-29 MED ORDER — CYCLOBENZAPRINE HCL 5 MG PO TABS: 5.0000 mg | ORAL_TABLET | Freq: Three times a day (TID) | ORAL | 0 refills | Status: DC | PRN

## 2024-05-29 NOTE — MAU Note (Signed)
..  Katelyn Bonilla is a 33 y.o. at [redacted]w[redacted]d here in MAU reporting: contractions since 1820 that are now every few minutes.  Denies vaginal bleeding or leaking of fluid. +FM Denis headache, visual changes, or epigastric pain.  Pain score: 8/10 Vitals:   05/29/24 2100  BP: (!) 141/85  Pulse: 77  Resp: 20  Temp: 98.1 F (36.7 C)  SpO2: 100%     FHT:150 Lab orders placed from triage:  UA

## 2024-05-29 NOTE — Discharge Instructions (Signed)
-   safe to take acetaminophen  (Tylenol ) in pregnancy. Up to 1000mg  every 6 hours  It's cold and flu season! Safe Medications in Pregnancy  - Take medications as directed on the package. Medications are listed by Brand name, store brands are considered equivalent to brand name, just be sure that the medications are the same. Ex: Tylenol  (acetaminophen ) - If taking multiple medications, please check labels to avoid duplicating the same active ingredients - Do not exceed 4000 mg of Tylenol  (acetaminophen ) in 24 hours - Do not take medications that contain aspirin or ibuprofen  (Motrin , Advil ) or naproxen (Aleve, Naprosyn)  Acne Benzoyl Peroxide Salicylic Acid  Allergies Benadryl  (diphenhydramine ) Claritin (loratadine) Flonase nasal spray (fluticasone) Saline nasal spray/drops  Backache/Headache Acetaminophen  (Tylenol ): 2 regular strength (325mg ) every 4 hours OR 2 extra strength (500mg ) every 6 hours  Colds/Coughs Breathe right strips Cepacol throat lozenges OR Chloraseptic throat spray cough drops, alcohol free Delsym (dextromethorphan, cough suppressant) Mucinex (guaifenesin, mucolytic/expectorant) Robitussin DM (dextromethorphan + guaifenesin) Saline nasal spray/drops Sudafed (pseudoephedrine, decongestant)  only after [redacted] weeks gestation and if you do not have high blood pressure Vicks Vaporub Zinc lozenges Zyrtec (cetirizine)  Constipation Immediate relief Ducolax suppositories (bisacodyl) Fleet enema (saline enema) glycerin  suppositories milk of magnesia Senokot (overnight) Smooth move tea (overnight)  to keep you regular Colace, Dulcolax (docusate) Metamucil (psyllium fiber) Miralax  (polyethylene glycol)   Diarrhea Kaopectate (bismuth subsalicylate) Imodium A-D (loperamide) *NO pepto Bismol Hemorrhoids Anusol  Anusol HC (Rx only) Preparation H Tucks  Indigestion Tums Maalox Mylanta Pepcid   Insomnia Benadryl  (alcohol free) 25mg  every 6 hours as  needed Tylenol  PM Unisom, no Gelcaps  Leg Cramps Tums MagGel  Nausea/Vomiting Bonine Dramamine Emetrol Ginger extract Sea bands Meclizine (Rx only)  Nausea medication to take during pregnancy Unisom (doxylamine succinate 25 mg tablets) Take one half tablet daily at bedtime. Vitamin B6 100mg  tablets. Take one tablet twice a day (up to 200 mg per day).  Skin Rashes: Aveeno products Benadryl  cream or 25mg  pill every 6 hours as needed Calamine Lotion 1% cortisone cream  Yeast infection: Gyne-lotrimin 7 Monistat 7

## 2024-05-29 NOTE — MAU Provider Note (Signed)
 History     CSN: [id number]  Arrival date and time: 05/29/24 2048 First Provider Initiated Contact with Patient   Chief Complaint  Patient presents with   Contractions    HPI Katelyn Bonilla is a 33 y.o. [id number] at [redacted]w[redacted]d, 07/18/2024, by Other Basis, who presents to the Maternity Assessment Unit for lower abdominal pain.  Pain is described as cramping, intermittent. This happened for hours today and has reduced since arriving to MAU, pain now 8/10, previously 9/10. No meds at home. Does wear a belly band.  ROS Contractions: Yes  Fetal Movement: Yes  Vaginal bleeding: No  LOF: No     Past Medical History:  Diagnosis Date   Acute cholecystitis due to biliary calculus 07/10/2023   Chronic hypertension with superimposed preeclampsia 05/30/2020   Gestational diabetes mellitus (GDM), antepartum 04/15/2020   Hypertension    Obesity    BMI >40   Pregnancy induced hypertension    Preterm premature rupture of membranes (PPROM) with unknown onset of labor 06/11/2020   Past Surgical History:  Procedure Laterality Date   CHOLECYSTECTOMY N/A 07/11/2023   Procedure: LAPAROSCOPIC CHOLECYSTECTOMY WITH ICG DYE;  Surgeon: Ebbie Cough, MD;  Location: [facility];  Service: General;  Laterality: N/A;   Allergies  Allergen Reactions   Oxycodone  Swelling    Facial edema reported 07/09/23    Physical Exam  BP (!) 150/82   Pulse 75   Temp 98.1 F (36.7 C) (Oral)   Resp 20   Ht 5' 1 (1.549 m)   Wt 113.9 kg   LMP 10/13/2023 Comment: negative pregnancy test on 07/11/23  SpO2 99%   BMI 47.44 kg/m   Gen: alert, no acute distress CV: regular rate Resp: nonlabored Abd: gravid  Cervical Exam Dilation: 1 Effacement (%): 10 Cervical Position: Posterior Exam by:: Dr.Cherilynn Schomburg  FHT Baseline: 140 bpm Variability: Good {> 6 bpm) Accelerations: Reactive Decelerations: Absent Uterine activity: flat, one peak  Labs     Results for orders placed or performed during the hospital  encounter of 05/29/24 (from the past 24 hours)  Urinalysis, Routine w reflex microscopic -Urine, Clean Catch     Status: Abnormal   Collection Time: 05/29/24  9:25 PM  Result Value Ref Range   Color, Urine STRAW (A) YELLOW   APPearance HAZY (A) CLEAR   Specific Gravity, Urine 1.003 (L) 1.005 - 1.030   pH 6.0 5.0 - 8.0   Glucose, UA NEGATIVE NEGATIVE mg/dL   Hgb urine dipstick NEGATIVE NEGATIVE   Bilirubin Urine NEGATIVE NEGATIVE   Ketones, ur NEGATIVE NEGATIVE mg/dL   Protein, ur 899 (A) NEGATIVE mg/dL   Nitrite NEGATIVE NEGATIVE   Leukocytes,Ua NEGATIVE NEGATIVE   RBC / HPF 0-5 0 - 5 RBC/hpf   WBC, UA 0-5 0 - 5 WBC/hpf   Bacteria, UA MANY (A) NONE SEEN   Squamous Epithelial / HPF 6-10 0 - 5 /HPF    Assessment and Plan  MDM Katelyn Bonilla is a 33 y.o. [id number] at [redacted]w[redacted]d, 07/18/2024, by Other Basis, who presents to the MAU for abdominal pain. Ddx: gas pain, cramping, contractions/BH ctx, roudn ligament pain, UTI, vaginitis.   Orders Placed This Encounter  Procedures   Urinalysis, Routine w reflex microscopic -Urine, Clean Catch   Discharge patient   Meds ordered this encounter  Medications   acetaminophen  (TYLENOL ) tablet 1,000 mg   cyclobenzaprine  (FLEXERIL ) tablet 5 mg   cyclobenzaprine  (FLEXERIL ) 5 MG tablet    Sig: Take 1 tablet (5 mg total) by mouth 3 (three)  times daily as needed for muscle spasms.    Dispense:  15 tablet    Refill:  0    Pt feeling improved after Tylenol  and Flexeril , pain now 4/10. She has acetaminophen  and home, will rx Flexeril .  UA with many bacteria but no leuks or nitrite, likely contaminated catch. No Ucx.    Abdominal cramping affecting pregnancy   Results pending at the time of DC: none Dispo: DC home in stable condition with return precautions discussed and included in AVS.    Barabara Maier, DO FM-OB Fellow [facility]

## 2024-05-30 ENCOUNTER — Other Ambulatory Visit: Payer: Self-pay

## 2024-05-30 ENCOUNTER — Inpatient Hospital Stay (HOSPITAL_COMMUNITY)
Admit: 2024-05-30 | Discharge: 2024-06-09 | DRG: 807 | Disposition: A | Attending: Obstetrics and Gynecology | Admitting: Obstetrics and Gynecology

## 2024-05-30 ENCOUNTER — Encounter (HOSPITAL_COMMUNITY): Payer: Self-pay | Admitting: Student

## 2024-05-30 DIAGNOSIS — O113 Pre-existing hypertension with pre-eclampsia, third trimester: Secondary | ICD-10-CM

## 2024-05-30 DIAGNOSIS — Z3A33 33 weeks gestation of pregnancy: Secondary | ICD-10-CM

## 2024-05-30 DIAGNOSIS — O119 Pre-existing hypertension with pre-eclampsia, unspecified trimester: Principal | ICD-10-CM

## 2024-05-30 DIAGNOSIS — O1493 Unspecified pre-eclampsia, third trimester: Secondary | ICD-10-CM | POA: Diagnosis present

## 2024-05-30 DIAGNOSIS — O24410 Gestational diabetes mellitus in pregnancy, diet controlled: Secondary | ICD-10-CM

## 2024-05-30 DIAGNOSIS — O10919 Unspecified pre-existing hypertension complicating pregnancy, unspecified trimester: Secondary | ICD-10-CM

## 2024-05-30 LAB — URINALYSIS, ROUTINE W REFLEX MICROSCOPIC
Bilirubin Urine: NEGATIVE
Glucose, UA: NEGATIVE mg/dL
Hgb urine dipstick: NEGATIVE
Ketones, ur: NEGATIVE mg/dL
Leukocytes,Ua: NEGATIVE
Nitrite: NEGATIVE
Protein, ur: 100 mg/dL — AB
Specific Gravity, Urine: 1.003 — ABNORMAL LOW (ref 1.005–1.030)
pH: 7 (ref 5.0–8.0)

## 2024-05-30 LAB — COMPREHENSIVE METABOLIC PANEL WITH GFR
ALT: 11 U/L (ref 0–44)
AST: 16 U/L (ref 15–41)
Albumin: 2.4 g/dL — ABNORMAL LOW (ref 3.5–5.0)
Alkaline Phosphatase: 92 U/L (ref 38–126)
Anion gap: 11 (ref 5–15)
BUN: 9 mg/dL (ref 6–20)
CO2: 21 mmol/L — ABNORMAL LOW (ref 22–32)
Calcium: 9.1 mg/dL (ref 8.9–10.3)
Chloride: 102 mmol/L (ref 98–111)
Creatinine, Ser: 0.6 mg/dL (ref 0.44–1.00)
GFR, Estimated: >60 mL/min (ref >60)
Glucose, Bld: 88 mg/dL (ref 70–99)
Potassium: 3.8 mmol/L (ref 3.5–5.1)
Sodium: 134 mmol/L — ABNORMAL LOW (ref 135–145)
Total Bilirubin: 0.5 mg/dL (ref 0.0–1.2)
Total Protein: 5.9 g/dL — ABNORMAL LOW (ref 6.5–8.1)

## 2024-05-30 LAB — CBC
HCT: 37.3 % (ref 36.0–46.0)
Hemoglobin: 12.7 g/dL (ref 12.0–15.0)
MCH: 28.5 pg (ref 26.0–34.0)
MCHC: 34 g/dL (ref 30.0–36.0)
MCV: 83.6 fL (ref 80.0–100.0)
Platelets: 313 K/uL (ref 150–400)
RBC: 4.46 MIL/uL (ref 3.87–5.11)
RDW: 13.2 % (ref 11.5–15.5)
WBC: 14 K/uL — ABNORMAL HIGH (ref 4.0–10.5)
nRBC: 0 % (ref 0.0–0.2)

## 2024-05-30 LAB — TYPE AND SCREEN
ABO/RH(D): O NEG
Antibody Screen: POSITIVE

## 2024-05-30 LAB — PROTEIN / CREATININE RATIO, URINE
Creatinine, Urine: 23 mg/dL
Protein Creatinine Ratio: 3.17 mg/mg{creat} — ABNORMAL HIGH (ref 0.00–0.15)
Total Protein, Urine: 73 mg/dL

## 2024-05-30 MED ORDER — ACETAMINOPHEN 500 MG PO TABS
1000.0000 mg | ORAL_TABLET | Freq: Once | ORAL | Status: AC
  Administered 2024-05-30: 1000 mg via ORAL
  Filled 2024-05-30: qty 2

## 2024-05-30 MED ORDER — LABETALOL HCL 5 MG/ML IV SOLN
20.0000 mg | INTRAVENOUS | Status: DC | PRN
  Administered 2024-06-06: 20 mg via INTRAVENOUS
  Filled 2024-05-30: qty 4

## 2024-05-30 MED ORDER — ACETAMINOPHEN 325 MG PO TABS
650.0000 mg | ORAL_TABLET | ORAL | Status: DC | PRN
  Administered 2024-05-31 (×2): 650 mg via ORAL
  Filled 2024-05-30 (×2): qty 2

## 2024-05-30 MED ORDER — HYDRALAZINE HCL 20 MG/ML IJ SOLN: 10.0000 mg | INTRAMUSCULAR | Status: DC | PRN

## 2024-05-30 MED ORDER — CYCLOBENZAPRINE HCL 5 MG PO TABS
10.0000 mg | ORAL_TABLET | Freq: Once | ORAL | Status: AC
  Administered 2024-05-30: 10 mg via ORAL
  Filled 2024-05-30: qty 2

## 2024-05-30 MED ORDER — CALCIUM CARBONATE ANTACID 500 MG PO CHEW: 2.0000 | CHEWABLE_TABLET | ORAL | Status: DC | PRN

## 2024-05-30 MED ORDER — LACTATED RINGERS IV BOLUS
500.0000 mL | Freq: Once | INTRAVENOUS | Status: AC
  Administered 2024-05-30: 500 mL via INTRAVENOUS

## 2024-05-30 MED ORDER — LABETALOL HCL 5 MG/ML IV SOLN: 40.0000 mg | INTRAVENOUS | Status: DC | PRN

## 2024-05-30 MED ORDER — NIFEDIPINE ER OSMOTIC RELEASE 30 MG PO TB24
30.0000 mg | ORAL_TABLET | Freq: Every day | ORAL | Status: DC
  Administered 2024-05-30: 30 mg via ORAL
  Filled 2024-05-30: qty 1

## 2024-05-30 MED ORDER — LABETALOL HCL 5 MG/ML IV SOLN: 80.0000 mg | INTRAVENOUS | Status: DC | PRN

## 2024-05-30 MED ORDER — PRENATAL MULTIVITAMIN CH
1.0000 | ORAL_TABLET | Freq: Every day | ORAL | Status: DC
  Administered 2024-05-31 – 2024-06-05 (×6): 1 via ORAL
  Filled 2024-05-30 (×6): qty 1

## 2024-05-30 MED ORDER — LACTATED RINGERS IV SOLN: 125.0000 mL/h | INTRAVENOUS | Status: AC

## 2024-05-30 NOTE — MAU Provider Note (Signed)
 History     CSN: [id number]  Arrival date and time: 05/30/24 1804   Event Date/Time   First Provider Initiated Contact with Patient 05/30/24 1922      Chief Complaint  Patient presents with   BP Evaluation   HPI Katelyn Bonilla is a 33 y.o. H6E9888 at [redacted]w[redacted]d who presents with hypertension & headache. Hx of chronic hypertension. No meds with this pregnancy. Hx of severe preeclampsia with her last pregnancy which delivered at 35 wks due to PPROM.  Headache started this morning, mostly in her neck. Hasn't treated symptoms. Denies visual disturbance or epigastric pain.  Denies contractions, vaginal bleeding, or LOF. Reports good fetal movement,  OB History     Gravida  3   Para  1   Term      Preterm  1   AB  1   Living  1      SAB  1   IAB      Ectopic      Multiple  0   Live Births  1           Past Medical History:  Diagnosis Date   Acute cholecystitis due to biliary calculus 07/10/2023   Chronic hypertension with superimposed preeclampsia 05/30/2020   Gestational diabetes mellitus (GDM), antepartum 04/15/2020   Hypertension    Obesity    BMI >40   Pregnancy induced hypertension    Preterm premature rupture of membranes (PPROM) with unknown onset of labor 06/11/2020    Past Surgical History:  Procedure Laterality Date   CHOLECYSTECTOMY N/A 07/11/2023   Procedure: LAPAROSCOPIC CHOLECYSTECTOMY WITH ICG DYE;  Surgeon: Ebbie Cough, MD;  Location: [facility];  Service: General;  Laterality: N/A;    Family History  Problem Relation Age of Onset   Diabetes Father     Social History   Tobacco Use   Smoking status: Never    Passive exposure: Never   Smokeless tobacco: Never  Vaping Use   Vaping status: Never Used  Substance Use Topics   Alcohol use: No   Drug use: No    Allergies:  Allergies  Allergen Reactions   Oxycodone  Swelling    Facial edema reported 07/09/23    Medications Prior to Admission  Medication Sig Dispense Refill  Last Dose/Taking   aspirin EC 81 MG tablet Take 81 mg by mouth daily. Swallow whole.   05/30/2024 at  2:00 PM   Cholecalciferol (VITAMIN D3) 125 MCG (5000 UT) TABS Take 1 tablet by mouth daily.   05/30/2024 at  2:00 PM   Famotidine  (PEPCID  PO) Take by mouth.   05/30/2024 at  4:00 PM   Prenatal Vit-Fe Fumarate-FA (PRENATAL MULTIVITAMIN) TABS tablet Take 1 tablet by mouth daily at 12 noon.   05/29/2024   cyclobenzaprine (FLEXERIL) 5 MG tablet Take 1 tablet (5 mg total) by mouth 3 (three) times daily as needed for muscle spasms. 15 tablet 0    metFORMIN  (GLUCOPHAGE ) 500 MG tablet Take by mouth 2 (two) times daily with a meal. (Patient not taking: Reported on 03/15/2024)       Review of Systems  All other systems reviewed and are negative.  Physical Exam   Blood pressure (!) 151/86, pulse 80, temperature 98.1 F (36.7 C), temperature source Oral, resp. rate 18, height 5' 1 (1.549 m), weight 114 kg, last menstrual period 10/13/2023, SpO2 100%.  Physical Exam Vitals and nursing note reviewed. Exam conducted with a chaperone present.  Constitutional:      General: She  is not in acute distress.    Appearance: Normal appearance. She is not ill-appearing.  HENT:     Head: Normocephalic and atraumatic.  Eyes:     General: No scleral icterus.    Pupils: Pupils are equal, round, and reactive to light.  Pulmonary:     Effort: Pulmonary effort is normal. No respiratory distress.  Abdominal:     Tenderness: There is no abdominal tenderness.     Comments: gravid  Musculoskeletal:     Right lower leg: 1+ Pitting Edema present.     Left lower leg: 1+ Pitting Edema present.  Skin:    General: Skin is warm and dry.  Neurological:     Mental Status: She is alert.     Deep Tendon Reflexes:     Reflex Scores:      Patellar reflexes are 2+ on the right side and 2+ on the left side.    Comments: No clonus    NST:  Baseline: 140 bpm, Variability: Good {> 6 bpm), Accelerations: Reactive, and  Decelerations: Absent   MAU Course  Procedures Results for orders placed or performed during the hospital encounter of 05/30/24 (from the past 24 hours)  Urinalysis, Routine w reflex microscopic -Urine, Clean Catch     Status: Abnormal   Collection Time: 05/30/24  6:56 PM  Result Value Ref Range   Color, Urine STRAW (A) YELLOW   APPearance CLEAR CLEAR   Specific Gravity, Urine 1.003 (L) 1.005 - 1.030   pH 7.0 5.0 - 8.0   Glucose, UA NEGATIVE NEGATIVE mg/dL   Hgb urine dipstick NEGATIVE NEGATIVE   Bilirubin Urine NEGATIVE NEGATIVE   Ketones, ur NEGATIVE NEGATIVE mg/dL   Protein, ur 899 (A) NEGATIVE mg/dL   Nitrite NEGATIVE NEGATIVE   Leukocytes,Ua NEGATIVE NEGATIVE   RBC / HPF 0-5 0 - 5 RBC/hpf   WBC, UA 0-5 0 - 5 WBC/hpf   Bacteria, UA RARE (A) NONE SEEN   Squamous Epithelial / HPF 0-5 0 - 5 /HPF  Protein / creatinine ratio, urine     Status: Abnormal   Collection Time: 05/30/24  6:56 PM  Result Value Ref Range   Creatinine, Urine 23 mg/dL   Total Protein, Urine 73 mg/dL   Protein Creatinine Ratio 3.17 (H) 0.00 - 0.15 mg/mg[Cre]  CBC     Status: Abnormal   Collection Time: 05/30/24  7:32 PM  Result Value Ref Range   WBC 14.0 (H) 4.0 - 10.5 K/uL   RBC 4.46 3.87 - 5.11 MIL/uL   Hemoglobin 12.7 12.0 - 15.0 g/dL   HCT 62.6 63.9 - 53.9 %   MCV 83.6 80.0 - 100.0 fL   MCH 28.5 26.0 - 34.0 pg   MCHC 34.0 30.0 - 36.0 g/dL   RDW 86.7 88.4 - 84.4 %   Platelets 313 150 - 400 K/uL   nRBC 0.0 0.0 - 0.2 %  Comprehensive metabolic panel with GFR     Status: Abnormal   Collection Time: 05/30/24  7:32 PM  Result Value Ref Range   Sodium 134 (L) 135 - 145 mmol/L   Potassium 3.8 3.5 - 5.1 mmol/L   Chloride 102 98 - 111 mmol/L   CO2 21 (L) 22 - 32 mmol/L   Glucose, Bld 88 70 - 99 mg/dL   BUN 9 6 - 20 mg/dL   Creatinine, Ser 9.39 0.44 - 1.00 mg/dL   Calcium  9.1 8.9 - 10.3 mg/dL   Total Protein 5.9 (L) 6.5 - 8.1 g/dL  Albumin 2.4 (L) 3.5 - 5.0 g/dL   AST 16 15 - 41 U/L   ALT 11 0 -  44 U/L   Alkaline Phosphatase 92 38 - 126 U/L   Total Bilirubin 0.5 0.0 - 1.2 mg/dL   GFR, Estimated >39 >39 mL/min   Anion gap 11 5 - 15    MDM   Assessment and Plan   1. Chronic hypertension with superimposed preeclampsia   2. [redacted] weeks gestation of pregnancy    -SRBP x 1. Remaining BPs mild range. Headache resolved with medication. CBC & CMP unremarkable. Protein creatinine ratio elevated to 3.17 giving patient diagnosis of superimposed preeclampsia.  -Reviewed with Dr. Alger who recommends starting procardia  (as per MFM's consult 9/19).  -Dr. Claudene updated with patient's evaluation. Will admit patient to [facility] for BP observation.   -Patient contracting regularly on TOCO. Denies pain & didn't know she was contracting. Cervix 0/0/-3 (ext 1 cm).   Rocky Satterfield 05/30/2024, 7:22 PM

## 2024-05-30 NOTE — MAU Note (Signed)
 Katelyn Bonilla is a 33 y.o. at [redacted]w[redacted]d here in MAU reporting: she's been taking her BP's at home and today BP was elevated.  Endorses current HA and neck pain, denies epigastric pain and visual disturbances.  Endorses +FM.  Denies VB and LOF.  LMP: 10/13/2023 Onset of complaint: today Pain score: 8 Vitals:   05/30/24 1843  BP: (!) 169/92  Pulse: 80  Resp: 18  Temp: 98.1 F (36.7 C)  SpO2: 100%     FHT: 145 bpm  Lab orders placed from triage: UA

## 2024-05-30 NOTE — H&P (Signed)
 Katelyn Bonilla is a 33 y.o. female presenting for worsening BP in setting of known chronic hypertension.  Patient reports a feeling of chest tightening prompting her to check her BP-at home it was 170s/90s. Denies HA, vision changes, SOB. She has been having worsening edema for the past two days.  On arrival to MAU, BP severe range x 1 then mild range.   Pregnancy is complicated:  1) Chronic hypertension: no meds. Of note, BP during MFM appt on 12/1 148/80 and 162/94 and BP at MAU visit 12/3 150/82. Has been normotensive for duration of prenatal visits. Prior pregnancy was delivered at 35 weeks for PPROM but she developed superimposed preE w/ SF intrapartum. BPP 12/1 8/8 2) GDMA1: reports FBG 80s and 2 hour PP low 100s.  3) IVF pregnancy 4) Maternal obesity: BMI 37 at work-up 5) LGA: 12/1 EFW 2513g/5lb9oz (95%), AC 99%.- repeat in 4w per MFM  She notes contractions q5 minutes, rating them 7/10. SVE by MAU provider 1 cm, same as exam 12/3. Denies LOF or VB.  OB History     Gravida  3   Para  1   Term      Preterm  1   AB  1   Living  1      SAB  1   IAB      Ectopic      Multiple  0   Live Births  1          Past Medical History:  Diagnosis Date   Acute cholecystitis due to biliary calculus 07/10/2023   Chronic hypertension with superimposed preeclampsia 05/30/2020   Gestational diabetes mellitus (GDM), antepartum 04/15/2020   Hypertension    Obesity    BMI >40   Pregnancy induced hypertension    Preterm premature rupture of membranes (PPROM) with unknown onset of labor 06/11/2020   Past Surgical History:  Procedure Laterality Date   CHOLECYSTECTOMY N/A 07/11/2023   Procedure: LAPAROSCOPIC CHOLECYSTECTOMY WITH ICG DYE;  Surgeon: Ebbie Cough, MD;  Location: [facility];  Service: General;  Laterality: N/A;   Family History: family history includes Diabetes in her father. Social History:  reports that she has never smoked. She has never been exposed to  tobacco smoke. She has never used smokeless tobacco. She reports that she does not drink alcohol and does not use drugs.     Maternal Diabetes: Yes:  Diabetes Type:  Diet controlled Genetic Screening: Normal Maternal Ultrasounds/Referrals: LGA Fetal Ultrasounds or other Referrals:  Fetal echo, Referred to Materal Fetal Medicine  Maternal Substance Abuse:  No Significant Maternal Medications:  Zofran , ASA Significant Maternal Lab Results:  Rh negative and Other: GBS unknown Number of Prenatal Visits:greater than 3 verified prenatal visits Maternal Vaccinations:TDap and Flu Other Comments:  None  Review of Systems  Constitutional:  Negative for chills and fever.  Eyes:  Negative for visual disturbance.  Respiratory:  Negative for shortness of breath.   Cardiovascular:  Negative for chest pain.  Gastrointestinal:  Negative for abdominal pain.  Genitourinary:  Positive for pelvic pain. Negative for vaginal bleeding and vaginal discharge.  Neurological:  Negative for headaches.   History   Blood pressure (!) 140/84, pulse 80, temperature 98.1 F (36.7 C), temperature source Oral, resp. rate 18, height 5' 1 (1.549 m), weight 114 kg, last menstrual period 10/13/2023, SpO2 98%. Exam Physical Exam Constitutional:      General: She is not in acute distress.    Appearance: She is obese.  HENT:  Head: Normocephalic and atraumatic.  Pulmonary:     Effort: Pulmonary effort is normal.  Musculoskeletal:        General: Normal range of motion.  Skin:    General: Skin is warm and dry.  Neurological:     Mental Status: She is alert.  Psychiatric:        Mood and Affect: Mood normal.        Behavior: Behavior normal.     Prenatal labs: ABO, Rh:  O negative Antibody:  Negative Rubella:  Non-immune RPR:   Non-reactive HBsAg:   Negative HIV: Non Reactive (01/13 1256)  GBS:   Unknown  Results for orders placed or performed during the hospital encounter of 05/30/24 (from the past  24 hours)  Urinalysis, Routine w reflex microscopic -Urine, Clean Catch     Status: Abnormal   Collection Time: 05/30/24  6:56 PM  Result Value Ref Range   Color, Urine STRAW (A) YELLOW   APPearance CLEAR CLEAR   Specific Gravity, Urine 1.003 (L) 1.005 - 1.030   pH 7.0 5.0 - 8.0   Glucose, UA NEGATIVE NEGATIVE mg/dL   Hgb urine dipstick NEGATIVE NEGATIVE   Bilirubin Urine NEGATIVE NEGATIVE   Ketones, ur NEGATIVE NEGATIVE mg/dL   Protein, ur 899 (A) NEGATIVE mg/dL   Nitrite NEGATIVE NEGATIVE   Leukocytes,Ua NEGATIVE NEGATIVE   RBC / HPF 0-5 0 - 5 RBC/hpf   WBC, UA 0-5 0 - 5 WBC/hpf   Bacteria, UA RARE (A) NONE SEEN   Squamous Epithelial / HPF 0-5 0 - 5 /HPF  Protein / creatinine ratio, urine     Status: Abnormal   Collection Time: 05/30/24  6:56 PM  Result Value Ref Range   Creatinine, Urine 23 mg/dL   Total Protein, Urine 73 mg/dL   Protein Creatinine Ratio 3.17 (H) 0.00 - 0.15 mg/mg[Cre]  CBC     Status: Abnormal   Collection Time: 05/30/24  7:32 PM  Result Value Ref Range   WBC 14.0 (H) 4.0 - 10.5 K/uL   RBC 4.46 3.87 - 5.11 MIL/uL   Hemoglobin 12.7 12.0 - 15.0 g/dL   HCT 62.6 63.9 - 53.9 %   MCV 83.6 80.0 - 100.0 fL   MCH 28.5 26.0 - 34.0 pg   MCHC 34.0 30.0 - 36.0 g/dL   RDW 86.7 88.4 - 84.4 %   Platelets 313 150 - 400 K/uL   nRBC 0.0 0.0 - 0.2 %  Comprehensive metabolic panel with GFR     Status: Abnormal   Collection Time: 05/30/24  7:32 PM  Result Value Ref Range   Sodium 134 (L) 135 - 145 mmol/L   Potassium 3.8 3.5 - 5.1 mmol/L   Chloride 102 98 - 111 mmol/L   CO2 21 (L) 22 - 32 mmol/L   Glucose, Bld 88 70 - 99 mg/dL   BUN 9 6 - 20 mg/dL   Creatinine, Ser 9.39 0.44 - 1.00 mg/dL   Calcium  9.1 8.9 - 10.3 mg/dL   Total Protein 5.9 (L) 6.5 - 8.1 g/dL   Albumin 2.4 (L) 3.5 - 5.0 g/dL   AST 16 15 - 41 U/L   ALT 11 0 - 44 U/L   Alkaline Phosphatase 92 38 - 126 U/L   Total Bilirubin 0.5 0.0 - 1.2 mg/dL   GFR, Estimated >39 >39 mL/min   Anion gap 11 5 - 15   Type and screen [facility]     Status: None   Collection Time:  05/30/24  9:29 PM  Result Value Ref Range   ABO/RH(D) O NEG    Antibody Screen POS    Sample Expiration 06/02/2024,2359    Antibody Identification      PASSIVELY ACQUIRED ANTI-D Performed at [facility], [address], [place], [place] [address]      Assessment/Plan: 33 yo [phone] @ 33.0, HD#1 admitted for observation in setting of cHTN w/ SI PreE w/o SF - cHTN w/ SI preE w/o SF: Ur Pr/Cr 3.17 previously 0.45 @ baseline, consistent with now superimposed pre-E. Start Procardia  30 XL. Vitals q4.Monitor for progression to SF - GDMA2: CBG fasting and 2 hour PP  - Preterm CTX: IVF bolus, SVE unchanged from 12/3. cEFM - Prematurity: GBS collected. NICU is near capacity, case discussed with on-call neonatology Dr. Shelba. Ok to admit, but notify if heading toward delivery. Pt is aware   Larraine DELENA Sharps 05/30/2024, 9:26 PM

## 2024-05-31 ENCOUNTER — Observation Stay (HOSPITAL_COMMUNITY)

## 2024-05-31 DIAGNOSIS — Z833 Family history of diabetes mellitus: Secondary | ICD-10-CM | POA: Diagnosis not present

## 2024-05-31 DIAGNOSIS — E669 Obesity, unspecified: Secondary | ICD-10-CM | POA: Diagnosis not present

## 2024-05-31 DIAGNOSIS — O1092 Unspecified pre-existing hypertension complicating childbirth: Secondary | ICD-10-CM | POA: Diagnosis present

## 2024-05-31 DIAGNOSIS — O10013 Pre-existing essential hypertension complicating pregnancy, third trimester: Secondary | ICD-10-CM | POA: Diagnosis not present

## 2024-05-31 DIAGNOSIS — O3663X0 Maternal care for excessive fetal growth, third trimester, not applicable or unspecified: Secondary | ICD-10-CM | POA: Diagnosis present

## 2024-05-31 DIAGNOSIS — O24410 Gestational diabetes mellitus in pregnancy, diet controlled: Secondary | ICD-10-CM | POA: Diagnosis not present

## 2024-05-31 DIAGNOSIS — O368130 Decreased fetal movements, third trimester, not applicable or unspecified: Secondary | ICD-10-CM | POA: Diagnosis present

## 2024-05-31 DIAGNOSIS — Z3A33 33 weeks gestation of pregnancy: Secondary | ICD-10-CM | POA: Diagnosis not present

## 2024-05-31 DIAGNOSIS — O1493 Unspecified pre-eclampsia, third trimester: Secondary | ICD-10-CM | POA: Diagnosis not present

## 2024-05-31 DIAGNOSIS — O113 Pre-existing hypertension with pre-eclampsia, third trimester: Secondary | ICD-10-CM | POA: Diagnosis not present

## 2024-05-31 DIAGNOSIS — O1413 Severe pre-eclampsia, third trimester: Secondary | ICD-10-CM | POA: Diagnosis not present

## 2024-05-31 DIAGNOSIS — O114 Pre-existing hypertension with pre-eclampsia, complicating childbirth: Secondary | ICD-10-CM | POA: Diagnosis present

## 2024-05-31 DIAGNOSIS — O99213 Obesity complicating pregnancy, third trimester: Secondary | ICD-10-CM | POA: Diagnosis not present

## 2024-05-31 DIAGNOSIS — O24420 Gestational diabetes mellitus in childbirth, diet controlled: Secondary | ICD-10-CM | POA: Diagnosis present

## 2024-05-31 DIAGNOSIS — O09813 Supervision of pregnancy resulting from assisted reproductive technology, third trimester: Secondary | ICD-10-CM | POA: Diagnosis not present

## 2024-05-31 DIAGNOSIS — O99214 Obesity complicating childbirth: Secondary | ICD-10-CM | POA: Diagnosis present

## 2024-05-31 LAB — GLUCOSE, CAPILLARY
Glucose-Capillary: 75 mg/dL (ref 70–99)
Glucose-Capillary: 88 mg/dL (ref 70–99)
Glucose-Capillary: 135 mg/dL — ABNORMAL HIGH (ref 70–99)
Glucose-Capillary: 137 mg/dL — ABNORMAL HIGH (ref 70–99)

## 2024-05-31 LAB — CBC WITH DIFFERENTIAL/PLATELET
Abs Immature Granulocytes: 0.04 K/uL (ref 0.00–0.07)
Basophils Absolute: 0 K/uL (ref 0.0–0.1)
Basophils Relative: 0 %
Eosinophils Absolute: 0.1 K/uL (ref 0.0–0.5)
Eosinophils Relative: 1 %
HCT: 35.9 % — ABNORMAL LOW (ref 36.0–46.0)
Hemoglobin: 12.3 g/dL (ref 12.0–15.0)
Immature Granulocytes: 0 %
Lymphocytes Relative: 26 %
Lymphs Abs: 3 K/uL (ref 0.7–4.0)
MCH: 28.8 pg (ref 26.0–34.0)
MCHC: 34.3 g/dL (ref 30.0–36.0)
MCV: 84.1 fL (ref 80.0–100.0)
Monocytes Absolute: 1.1 K/uL — ABNORMAL HIGH (ref 0.1–1.0)
Monocytes Relative: 9 %
Neutro Abs: 7.4 K/uL (ref 1.7–7.7)
Neutrophils Relative %: 64 %
Platelets: 267 K/uL (ref 150–400)
RBC: 4.27 MIL/uL (ref 3.87–5.11)
RDW: 13.2 % (ref 11.5–15.5)
WBC: 11.7 K/uL — ABNORMAL HIGH (ref 4.0–10.5)
nRBC: 0 % (ref 0.0–0.2)

## 2024-05-31 LAB — COMPREHENSIVE METABOLIC PANEL WITH GFR
ALT: 10 U/L (ref 0–44)
AST: 15 U/L (ref 15–41)
Albumin: 2 g/dL — ABNORMAL LOW (ref 3.5–5.0)
Alkaline Phosphatase: 73 U/L (ref 38–126)
Anion gap: 9 (ref 5–15)
BUN: 10 mg/dL (ref 6–20)
CO2: 21 mmol/L — ABNORMAL LOW (ref 22–32)
Calcium: 8.4 mg/dL — ABNORMAL LOW (ref 8.9–10.3)
Chloride: 107 mmol/L (ref 98–111)
Creatinine, Ser: 0.59 mg/dL (ref 0.44–1.00)
GFR, Estimated: >60 mL/min (ref >60)
Glucose, Bld: 87 mg/dL (ref 70–99)
Potassium: 3.9 mmol/L (ref 3.5–5.1)
Sodium: 137 mmol/L (ref 135–145)
Total Bilirubin: 0.2 mg/dL (ref 0.0–1.2)
Total Protein: 5.1 g/dL — ABNORMAL LOW (ref 6.5–8.1)

## 2024-05-31 LAB — SYPHILIS: RPR W/REFLEX TO RPR TITER AND TREPONEMAL ANTIBODIES, TRADITIONAL SCREENING AND DIAGNOSIS ALGORITHM: RPR Ser Ql: NONREACTIVE

## 2024-05-31 MED ORDER — LACTATED RINGERS IV SOLN: INTRAVENOUS | Status: DC

## 2024-05-31 MED ORDER — PANTOPRAZOLE SODIUM 40 MG PO TBEC
40.0000 mg | DELAYED_RELEASE_TABLET | Freq: Every day | ORAL | Status: DC
  Administered 2024-05-31 – 2024-06-06 (×7): 40 mg via ORAL
  Filled 2024-05-31 (×7): qty 1

## 2024-05-31 MED ORDER — BUTALBITAL-APAP-CAFFEINE 50-325-40 MG PO TABS: 1.0000 | ORAL_TABLET | Freq: Four times a day (QID) | ORAL | Status: DC | PRN

## 2024-05-31 MED ORDER — NIFEDIPINE ER OSMOTIC RELEASE 30 MG PO TB24
30.0000 mg | ORAL_TABLET | Freq: Two times a day (BID) | ORAL | Status: DC
  Filled 2024-05-31: qty 1

## 2024-05-31 MED ORDER — BUTALBITAL-APAP-CAFFEINE 50-325-40 MG PO TABS
1.0000 | ORAL_TABLET | Freq: Once | ORAL | Status: AC
  Administered 2024-05-31: 1 via ORAL
  Filled 2024-05-31: qty 1

## 2024-05-31 MED ORDER — LABETALOL HCL 5 MG/ML IV SOLN
20.0000 mg | INTRAVENOUS | Status: DC | PRN
  Administered 2024-06-04 – 2024-06-06 (×2): 20 mg via INTRAVENOUS
  Filled 2024-05-31 (×2): qty 4

## 2024-05-31 MED ORDER — LABETALOL HCL 5 MG/ML IV SOLN
40.0000 mg | INTRAVENOUS | Status: DC | PRN
  Administered 2024-06-06: 40 mg via INTRAVENOUS
  Filled 2024-05-31: qty 8

## 2024-05-31 MED ORDER — HYDRALAZINE HCL 20 MG/ML IJ SOLN: 10.0000 mg | INTRAMUSCULAR | Status: DC | PRN

## 2024-05-31 MED ORDER — DIPHENHYDRAMINE HCL 50 MG/ML IJ SOLN
25.0000 mg | Freq: Once | INTRAMUSCULAR | Status: AC
  Administered 2024-05-31: 25 mg via INTRAVENOUS
  Filled 2024-05-31: qty 1

## 2024-05-31 MED ORDER — BETAMETHASONE SOD PHOS & ACET 6 (3-3) MG/ML IJ SUSP
12.0000 mg | INTRAMUSCULAR | Status: AC
  Administered 2024-05-31 – 2024-06-01 (×2): 12 mg via INTRAMUSCULAR
  Filled 2024-05-31: qty 5

## 2024-05-31 MED ORDER — NIFEDIPINE ER OSMOTIC RELEASE 30 MG PO TB24
30.0000 mg | ORAL_TABLET | Freq: Two times a day (BID) | ORAL | Status: DC
  Administered 2024-05-31: 30 mg via ORAL
  Filled 2024-05-31: qty 1

## 2024-05-31 MED ORDER — LABETALOL HCL 5 MG/ML IV SOLN: 80.0000 mg | INTRAVENOUS | Status: DC | PRN

## 2024-05-31 MED ORDER — MAGNESIUM SULFATE 40 GM/1000ML IV SOLN
2.0000 g/h | INTRAVENOUS | Status: DC
  Administered 2024-06-01: 1 g/h via INTRAVENOUS
  Filled 2024-05-31 (×2): qty 1000

## 2024-05-31 MED ORDER — DIPHENHYDRAMINE HCL 25 MG PO CAPS
25.0000 mg | ORAL_CAPSULE | Freq: Four times a day (QID) | ORAL | Status: DC | PRN
  Administered 2024-05-31: 25 mg via ORAL
  Filled 2024-05-31: qty 1

## 2024-05-31 MED ORDER — IOHEXOL 350 MG/ML SOLN
75.0000 mL | Freq: Once | INTRAVENOUS | Status: AC | PRN
  Administered 2024-05-31: 75 mL via INTRAVENOUS

## 2024-05-31 MED ORDER — CYCLOBENZAPRINE HCL 5 MG PO TABS
5.0000 mg | ORAL_TABLET | Freq: Four times a day (QID) | ORAL | Status: AC | PRN
  Administered 2024-05-31 – 2024-06-04 (×2): 10 mg via ORAL
  Filled 2024-05-31 (×5): qty 2

## 2024-05-31 MED ORDER — METOCLOPRAMIDE HCL 5 MG/ML IJ SOLN
10.0000 mg | Freq: Once | INTRAMUSCULAR | Status: AC
  Administered 2024-05-31: 10 mg via INTRAVENOUS
  Filled 2024-05-31: qty 2

## 2024-05-31 MED ORDER — LABETALOL HCL 200 MG PO TABS
200.0000 mg | ORAL_TABLET | Freq: Once | ORAL | Status: AC
  Administered 2024-05-31: 200 mg via ORAL
  Filled 2024-05-31: qty 1

## 2024-05-31 MED ORDER — METOCLOPRAMIDE HCL 10 MG PO TABS
10.0000 mg | ORAL_TABLET | Freq: Four times a day (QID) | ORAL | Status: DC | PRN
  Administered 2024-05-31: 10 mg via ORAL
  Filled 2024-05-31: qty 1

## 2024-05-31 MED ORDER — MAGNESIUM SULFATE BOLUS VIA INFUSION
4.0000 g | Freq: Once | INTRAVENOUS | Status: AC
  Administered 2024-05-31: 4 g via INTRAVENOUS
  Filled 2024-05-31: qty 1000

## 2024-05-31 MED ORDER — CYCLOBENZAPRINE HCL 5 MG PO TABS
7.5000 mg | ORAL_TABLET | Freq: Once | ORAL | Status: AC
  Administered 2024-05-31: 7.5 mg via ORAL
  Filled 2024-05-31: qty 1.5

## 2024-05-31 NOTE — Progress Notes (Signed)
 Patient ID: Katelyn Bonilla, female   DOB: 01-Mar-1991, 33 y.o.   MRN: [id number]   Pt reports HA now a 6/10  SBP improved but diastolic still in 140s despite am procardia   I had discussion with pt and husband about severe features of preE vs side effect of procardia  causing HA.  They expressed concern about risks of preterm delivery - had a 28week delivery with last child.   Will plan on magnesium  sulfate for CP prophylaxis given persistent HA and thereby possible risk of need for preterm delivery  I will also give another dose of fioricet  now as pt has residual pain  Will trend BP q 2hrs and labs daily  NICU consult ordered as well

## 2024-05-31 NOTE — Progress Notes (Signed)
 Patient ID: Katelyn Bonilla, female   DOB: July 17, 1990, 33 y.o.   MRN: [id number] HD#2 Pt c/o 10/10 frontal HA this am. Has tried a caffeinated drink with no improvement. She was started on procardia  30xl last night. BP improved but still elevated this am. +Fms. Denies contractions or cramping. No scotomata  Today's Vitals   05/31/24 0414 05/31/24 0752 05/31/24 0820 05/31/24 0920  BP:  (!) 149/99    Pulse:  88    Resp:  18    Temp:  97.8 F (36.6 C)    TempSrc:  Oral    SpO2:  99%    Weight:      Height:      PainSc: 6   9  9     Body mass index is 47.48 kg/m.  GEN - in some discomfort - holding her head- otherwise conversant and alert and oriented x 3  ABD - cw gestational age   EXT - no homans   No labs this am  A/P: 68bn H6E9888 at 50 1/7wks  Chtn w/ SI preE now concerning for severe features this am due to persistent HA  - informed pt of possible s/e of procardia  causing HA. Will treat with fioricet  at this time - Will check preE labs : cbc and cmp ; already known to have proteinuria  - procardia  increased to bid ; dose due now as last dose was at 7pm last night - Consider magnesium  sulfate if labs show worsening preE - Counseled pt on possible need for delivery at 34 weeks if preE worsening - Will treat with BMZ now and again tomorrow  - NICU likely to be in green in next few hours per charge nurse - Will obtain a NICU consult  2. GDMA2 - fasting CBG 75 this am; check fasting and 2hr pp daily   3. History of preterm delivery due to PPROM. Had preterrm contractions at admission yesterday but none at this time  - GBS pending  - NST q shift and prn abdominal pain  4. IVF pregnancy   5. LGA - 12/1 EFW 2513g/5lb9oz (95%), AC 99%.- repeat in 4w per MFM ( 06/27/2024)

## 2024-05-31 NOTE — Progress Notes (Signed)
 Patient ID: Katelyn Bonilla, female   DOB: [birth year], 33 y.o.   MRN: [id number] Pt HA unresolved with fioricet .  CBC and LFTs in normal range  Will give a HA cocktail with reglan /benadryl /flexeril  now.   Will plan on a head CT w/wout contrast  If wnl and sx unresolved, will proceed with magnesium  sulfate for CP prophylaxis and possible imminent delivery  S/P first dose of BMZ  Serial BP to be checked q 2hrs

## 2024-05-31 NOTE — Progress Notes (Signed)
 Patient ID: Katelyn Bonilla, female   DOB: 04-29-1991, 33 y.o.   MRN: [id number] Pt reports feels much better after her second migraine cocktail (flexeril /benadryl /reglan ). She was finally able to take a nap. She reports no scotomata. +FMs. No contractions  VS: 135/68 (135-141/68-74) GEN - NAD, sleepy ABD - soft, cw gestational age  EXT - no homans   EFM - 135, moderate variability, no decels TOCO - no homans  SVE - deferred    A/P: 68bn H6E9888 at 33 1/7wks  Chtn w/ SI preE now concerning for severe features : HA improved with migraine cocktails and fioricet   - switched from procardia  to labetalol . - Started on magnesium  sulfate for CP prophylaxis ( end on 06/01/24 at 1645) - Counseled pt on possible need for delivery at 34 weeks if preE worsening - s/p BMZ today ; next due 06/01/24 at 1135a - s/p neonatology consult; NICU in green   2. GDMA2 - fasting CBG/2hr pp daily; wnl   3. History of preterm delivery due to PPROM. Had preterrm contractions at admission yesterday but none today  - GBS pending  - NST q shift and prn abdominal pain  4. Tachycardia - improved

## 2024-05-31 NOTE — Consult Note (Signed)
 Neonatology Consult to Antenatal Patient:  I was asked by Dr. Delana to see this patient in order to provide antenatal counseling due to threatening preterm delivery.  Mrs Katelyn Bonilla was admitted 05/30/2024 at 32.[redacted] weeks GA for worsening BP in the setting of CHTN. She is currently not having active labor; membranes are intact. Fetal status is reassuring. She is getting BMZ and Mag.  Pregnancy is complicated by [facility] on meds, GDM, obesoty, IVF pregnancy, and infant LGA.  I spoke with the patient and by phone husband. We discussed the worst case of delivery in the next 1-2 days, including usual DR management, possible respiratory complications and need for support, IV access, feedings (mother desires breast feeding, which was encouraged, and they decline use of donor breast milk as a bridge for religious reasons), LOS, Mortality and Morbidity, and long term outcomes.  Many good questions were answered; they have had a previous child born at 35w do well but also have a niece at 28w who has had long term developmental issues.  I/we would be glad to come back if there are more questions later.  Thank you for asking me to see this patient.  Alm MOTE Othella, MD Neonatologist 05/31/2024, 10:48 PM  The total length of face-to-face or floor/unit time for this encounter was 25 minutes. Counseling and/or coordination of care was 40 minutes of the above.

## 2024-05-31 NOTE — Progress Notes (Signed)
 Glucose machine not crossing over. Fasting CBG 75.

## 2024-06-01 LAB — CBC
HCT: 37.7 % (ref 36.0–46.0)
Hemoglobin: 13 g/dL (ref 12.0–15.0)
MCH: 29.1 pg (ref 26.0–34.0)
MCHC: 34.5 g/dL (ref 30.0–36.0)
MCV: 84.3 fL (ref 80.0–100.0)
Platelets: 302 K/uL (ref 150–400)
RBC: 4.47 MIL/uL (ref 3.87–5.11)
RDW: 13.2 % (ref 11.5–15.5)
WBC: 21.7 K/uL — ABNORMAL HIGH (ref 4.0–10.5)
nRBC: 0 % (ref 0.0–0.2)

## 2024-06-01 LAB — COMPREHENSIVE METABOLIC PANEL WITH GFR
ALT: 12 U/L (ref 0–44)
AST: 14 U/L — ABNORMAL LOW (ref 15–41)
Albumin: 2.3 g/dL — ABNORMAL LOW (ref 3.5–5.0)
Alkaline Phosphatase: 78 U/L (ref 38–126)
Anion gap: 11 (ref 5–15)
BUN: 13 mg/dL (ref 6–20)
CO2: 19 mmol/L — ABNORMAL LOW (ref 22–32)
Calcium: 7.3 mg/dL — ABNORMAL LOW (ref 8.9–10.3)
Chloride: 105 mmol/L (ref 98–111)
Creatinine, Ser: 0.68 mg/dL (ref 0.44–1.00)
GFR, Estimated: >60 mL/min (ref >60)
Glucose, Bld: 105 mg/dL — ABNORMAL HIGH (ref 70–99)
Potassium: 4.8 mmol/L (ref 3.5–5.1)
Sodium: 135 mmol/L (ref 135–145)
Total Bilirubin: 0.5 mg/dL (ref 0.0–1.2)
Total Protein: 5.4 g/dL — ABNORMAL LOW (ref 6.5–8.1)

## 2024-06-01 LAB — CULTURE, BETA STREP (GROUP B ONLY)

## 2024-06-01 LAB — MAGNESIUM: Magnesium: 5.7 mg/dL — ABNORMAL HIGH (ref 1.7–2.4)

## 2024-06-01 LAB — GLUCOSE, CAPILLARY
Glucose-Capillary: 99 mg/dL (ref 70–99)
Glucose-Capillary: 150 mg/dL — ABNORMAL HIGH (ref 70–99)
Glucose-Capillary: 175 mg/dL — ABNORMAL HIGH (ref 70–99)
Glucose-Capillary: 148 mg/dL — ABNORMAL HIGH (ref 70–99)

## 2024-06-01 MED ORDER — ACETAMINOPHEN-CAFFEINE 500-65 MG PO TABS
2.0000 | ORAL_TABLET | Freq: Four times a day (QID) | ORAL | Status: DC | PRN
  Filled 2024-06-01: qty 2

## 2024-06-01 MED ORDER — INSULIN ASPART 100 UNIT/ML IJ SOLN
0.0000 [IU] | Freq: Three times a day (TID) | INTRAMUSCULAR | Status: DC
  Administered 2024-06-01: 3 [IU] via SUBCUTANEOUS
  Administered 2024-06-01: 2 [IU] via SUBCUTANEOUS
  Administered 2024-06-02: 1 [IU] via SUBCUTANEOUS
  Administered 2024-06-02 (×2): 2 [IU] via SUBCUTANEOUS
  Administered 2024-06-03 – 2024-06-04 (×4): 1 [IU] via SUBCUTANEOUS
  Filled 2024-06-01: qty 2
  Filled 2024-06-01: qty 3
  Filled 2024-06-01 (×2): qty 1
  Filled 2024-06-01 (×3): qty 2

## 2024-06-01 MED ORDER — FAMOTIDINE 20 MG PO TABS
20.0000 mg | ORAL_TABLET | Freq: Two times a day (BID) | ORAL | Status: DC | PRN
  Administered 2024-06-01: 20 mg via ORAL
  Filled 2024-06-01: qty 1

## 2024-06-01 MED ORDER — ACETAMINOPHEN 500 MG PO TABS
1000.0000 mg | ORAL_TABLET | Freq: Four times a day (QID) | ORAL | Status: DC | PRN
  Administered 2024-06-01 – 2024-06-04 (×2): 1000 mg via ORAL
  Filled 2024-06-01 (×2): qty 2

## 2024-06-01 MED ORDER — LABETALOL HCL 200 MG PO TABS
200.0000 mg | ORAL_TABLET | Freq: Two times a day (BID) | ORAL | Status: DC
  Administered 2024-06-01 – 2024-06-06 (×11): 200 mg via ORAL
  Filled 2024-06-01 (×11): qty 1

## 2024-06-01 NOTE — Plan of Care (Signed)
 Problem: Education: Goal: Knowledge of disease or condition will improve 06/01/2024 0926 by Knute Suzen RAMAN, RN Outcome: Progressing 06/01/2024 0924 by Knute Suzen RAMAN, RN Outcome: Progressing Goal: Knowledge of the prescribed therapeutic regimen will improve 06/01/2024 0926 by Knute Suzen RAMAN, RN Outcome: Progressing 06/01/2024 0924 by Knute Suzen RAMAN, RN Outcome: Progressing Goal: Individualized Educational Video(s) 06/01/2024 0926 by Knute Suzen RAMAN, RN Outcome: Progressing 06/01/2024 0924 by Knute Suzen RAMAN, RN Outcome: Progressing   Problem: Clinical Measurements: Goal: Complications related to the disease process, condition or treatment will be avoided or minimized 06/01/2024 0926 by Knute Suzen RAMAN, RN Outcome: Progressing 06/01/2024 0924 by Knute Suzen RAMAN, RN Outcome: Progressing   Problem: Education: Goal: Knowledge of disease or condition will improve 06/01/2024 0926 by Knute Suzen RAMAN, RN Outcome: Progressing 06/01/2024 0924 by Knute Suzen RAMAN, RN Outcome: Progressing Goal: Knowledge of the prescribed therapeutic regimen will improve 06/01/2024 0926 by Knute Suzen RAMAN, RN Outcome: Progressing 06/01/2024 0924 by Knute Suzen RAMAN, RN Outcome: Progressing   Problem: Fluid Volume: Goal: Peripheral tissue perfusion will improve 06/01/2024 0926 by Knute Suzen RAMAN, RN Outcome: Progressing 06/01/2024 0924 by Knute Suzen RAMAN, RN Outcome: Progressing   Problem: Clinical Measurements: Goal: Complications related to disease process, condition or treatment will be avoided or minimized 06/01/2024 0926 by Knute Suzen RAMAN, RN Outcome: Progressing 06/01/2024 0924 by Knute Suzen RAMAN, RN Outcome: Progressing   Problem: Education: Goal: Knowledge of General Education information will improve Description: Including pain rating scale, medication(s)/side effects and non-pharmacologic comfort measures 06/01/2024 0926 by Knute Suzen RAMAN,  RN Outcome: Progressing 06/01/2024 0924 by Knute Suzen RAMAN, RN Outcome: Progressing   Problem: Health Behavior/Discharge Planning: Goal: Ability to manage health-related needs will improve 06/01/2024 0926 by Knute Suzen RAMAN, RN Outcome: Progressing 06/01/2024 0924 by Knute Suzen RAMAN, RN Outcome: Progressing   Problem: Clinical Measurements: Goal: Ability to maintain clinical measurements within normal limits will improve 06/01/2024 0926 by Knute Suzen RAMAN, RN Outcome: Progressing 06/01/2024 0924 by Knute Suzen RAMAN, RN Outcome: Progressing Goal: Will remain free from infection 06/01/2024 0926 by Knute Suzen RAMAN, RN Outcome: Progressing 06/01/2024 0924 by Knute Suzen RAMAN, RN Outcome: Progressing Goal: Diagnostic test results will improve 06/01/2024 0926 by Knute Suzen RAMAN, RN Outcome: Progressing 06/01/2024 0924 by Knute Suzen RAMAN, RN Outcome: Progressing Goal: Respiratory complications will improve 06/01/2024 0926 by Knute Suzen RAMAN, RN Outcome: Progressing 06/01/2024 0924 by Knute Suzen RAMAN, RN Outcome: Progressing Goal: Cardiovascular complication will be avoided 06/01/2024 0926 by Knute Suzen RAMAN, RN Outcome: Progressing 06/01/2024 0924 by Knute Suzen RAMAN, RN Outcome: Progressing   Problem: Activity: Goal: Risk for activity intolerance will decrease 06/01/2024 0926 by Knute Suzen RAMAN, RN Outcome: Progressing 06/01/2024 0924 by Knute Suzen RAMAN, RN Outcome: Progressing   Problem: Nutrition: Goal: Adequate nutrition will be maintained 06/01/2024 0926 by Knute Suzen RAMAN, RN Outcome: Progressing 06/01/2024 0924 by Knute Suzen RAMAN, RN Outcome: Progressing   Problem: Coping: Goal: Level of anxiety will decrease 06/01/2024 0926 by Knute Suzen RAMAN, RN Outcome: Progressing 06/01/2024 0924 by Knute Suzen RAMAN, RN Outcome: Progressing   Problem: Elimination: Goal: Will not experience complications related to bowel  motility 06/01/2024 0926 by Knute Suzen RAMAN, RN Outcome: Progressing 06/01/2024 0924 by Knute Suzen RAMAN, RN Outcome: Progressing Goal: Will not experience complications related to urinary retention 06/01/2024 0926 by Knute Suzen RAMAN, RN Outcome: Progressing 06/01/2024 0924 by Knute Suzen RAMAN, RN Outcome: Progressing   Problem: Pain Managment: Goal: General experience of comfort will improve and/or be controlled  06/01/2024 0926 by Knute Suzen RAMAN, RN Outcome: Progressing 06/01/2024 0924 by Knute Suzen RAMAN, RN Outcome: Progressing   Problem: Safety: Goal: Ability to remain free from injury will improve 06/01/2024 0926 by Knute Suzen RAMAN, RN Outcome: Progressing 06/01/2024 0924 by Knute Suzen RAMAN, RN Outcome: Progressing   Problem: Skin Integrity: Goal: Risk for impaired skin integrity will decrease 06/01/2024 0926 by Knute Suzen RAMAN, RN Outcome: Progressing 06/01/2024 0924 by Knute Suzen RAMAN, RN Outcome: Progressing

## 2024-06-01 NOTE — Progress Notes (Signed)
 Patient ID: Katelyn Bonilla, female   DOB: 1990-11-29, 33 y.o.   MRN: [id number] Chart check  BP in good range at 135-138/ 61-75 since midnight.   Fluids in: I 745.7 ml UOP:  Plan: since pt BP better on labetalol , will continue on this : ordered 200mg  bid; next dose at 7am -Complete 24hrs og magnesium  sulfate at 1645p - Second dose BMZ today at 1135a

## 2024-06-01 NOTE — Plan of Care (Signed)
  Problem: Education: Goal: Knowledge of disease or condition will improve Outcome: Progressing Goal: Knowledge of the prescribed therapeutic regimen will improve Outcome: Progressing Goal: Individualized Educational Video(s) Outcome: Progressing   Problem: Clinical Measurements: Goal: Complications related to the disease process, condition or treatment will be avoided or minimized Outcome: Progressing   Problem: Education: Goal: Knowledge of disease or condition will improve Outcome: Progressing Goal: Knowledge of the prescribed therapeutic regimen will improve Outcome: Progressing   Problem: Fluid Volume: Goal: Peripheral tissue perfusion will improve Outcome: Progressing   Problem: Clinical Measurements: Goal: Complications related to disease process, condition or treatment will be avoided or minimized Outcome: Progressing   Problem: Education: Goal: Knowledge of General Education information will improve Description: Including pain rating scale, medication(s)/side effects and non-pharmacologic comfort measures Outcome: Progressing   Problem: Health Behavior/Discharge Planning: Goal: Ability to manage health-related needs will improve Outcome: Progressing   Problem: Clinical Measurements: Goal: Ability to maintain clinical measurements within normal limits will improve Outcome: Progressing Goal: Will remain free from infection Outcome: Progressing Goal: Diagnostic test results will improve Outcome: Progressing Goal: Respiratory complications will improve Outcome: Progressing Goal: Cardiovascular complication will be avoided Outcome: Progressing   Problem: Activity: Goal: Risk for activity intolerance will decrease Outcome: Progressing   Problem: Nutrition: Goal: Adequate nutrition will be maintained Outcome: Progressing   Problem: Coping: Goal: Level of anxiety will decrease Outcome: Progressing   Problem: Elimination: Goal: Will not experience  complications related to bowel motility Outcome: Progressing Goal: Will not experience complications related to urinary retention Outcome: Progressing   Problem: Pain Managment: Goal: General experience of comfort will improve and/or be controlled Outcome: Progressing   Problem: Safety: Goal: Ability to remain free from injury will improve Outcome: Progressing   Problem: Skin Integrity: Goal: Risk for impaired skin integrity will decrease Outcome: Progressing

## 2024-06-01 NOTE — Progress Notes (Signed)
 BP has improved with transition to labetalol .   Magnesium  level returned at 5.7--in range, but upper limit.  Decreased to 1 gm / hr prior to stopping at 24 hr mark at 1645.  Patient reported resolution of symptoms.  Will continue to monitor blood pressure closely

## 2024-06-01 NOTE — Progress Notes (Signed)
 Katelyn Bonilla 32 y.o. H6E9888 at 107w2d HD#3  admitted with superimposed preeclampsia S: Reports headache is much improved since stopping the procardia  Xl and switching to labetalol  However, has noted over the last 2-3 hours feeling a bit dizzy/foggy and feeling some chest tightness and shortness of breath.  O2 sats were 100% at the time of this complaint, HR 91   She reports mild lower abdominal cramping.  No upper abdominal pain  O: Vitals:   06/01/24 0329 06/01/24 0430 06/01/24 0522 06/01/24 0625  BP: 138/65     Pulse: 91     Resp: 16 18 19 16   Temp:      TempSrc:      SpO2:      Weight:      Height:       Lungs: clear to auscultation bilaterally, no crackles or decreased breath sounds Heart: RRR LE: 1+ edema bilaterally, 1+ DTRs SVE deferred   NST 130s moderate variability, + accelerations, category 1  A/p: Katelyn Bonilla 32 y.o. H6E9888 at [redacted]w[redacted]d HD#3 admitted with superimposed preeclampsia   CHTN with superimposed preeclampsia:  BPs controlled on labetalol  200 mg PO BID.  She had a persistent headache yesterday that resolved with headache cocktail x 2 and discontinuation of procardia  XL.  During this time period, she was started on magnesium  sulfate for 24 hours (due to stop at 1645 today).  Concern at this time for potential supra therapeutic level given symptoms.  Will check labs and decrease rate to 1gm / hour.  If symptoms persist, consider chest Xray.  Delivery at 34 weeks, or sooner if lab abnormalities or persistent severe fetaure Prematurity: BMZ series, second dose due at 1100 today.  S/p NICU consultation.  GBS culture was collected on admission and is still pending GDMA1: BG trending up s/p bmz administration.  Will start SSI for correction today.   LGA infant: 12/1 EFW 2513g/5lb9oz (95%), AC 99%  History of preterm delivery at 35 weeks due to PPROM (developed SI preeclampsia as well during this admission) NST continuous monitoring while on magnesium   sulfate     Denzil Mceachron GEFFEL [facility] 06/01/24 8:41 AM

## 2024-06-02 ENCOUNTER — Inpatient Hospital Stay (HOSPITAL_COMMUNITY)

## 2024-06-02 LAB — GLUCOSE, CAPILLARY
Glucose-Capillary: 110 mg/dL — ABNORMAL HIGH (ref 70–99)
Glucose-Capillary: 134 mg/dL — ABNORMAL HIGH (ref 70–99)
Glucose-Capillary: 132 mg/dL — ABNORMAL HIGH (ref 70–99)
Glucose-Capillary: 99 mg/dL (ref 70–99)

## 2024-06-02 NOTE — Progress Notes (Addendum)
 Katelyn Bonilla 33 y.o. H6E9888 at [redacted]w[redacted]d HD#4  admitted with superimposed preeclampsia S: Feeling much improved today since stopping magnesium  sulfate.  SOB and dizziness has not returned.  Had some mild upper GI discomfort yesterday evening that resolved quickly w famotidine  and passing gas.  Headache has not returned since transitioning from nifedipine  Xl to labetalol .  She notes she is feeling movement, but has been less since the magnesium  sulfate infusion O: Vitals:   05/30/24 1837 06/02/24 0000 06/02/24 0449 06/02/24 0803  BP:  112/60 115/60 117/67  Pulse:  88 77 71  Resp:  18 16 17   Temp:  98.1 F (36.7 C) 97.8 F (36.6 C) 97.7 F (36.5 C)  TempSrc:  Oral Oral Oral  SpO2:  96% 96% 96%  Weight: 114 kg     Height: 5' 1 (1.549 m)      Lungs: clear to auscultation bilaterally, no crackles or decreased breath sounds Heart: RRR LE: 1+ edema bilaterally, 2+ DTRs SVE deferred   NST 140s moderate variability, 10 x 10 accelerations but no 15x15 at this time, no decelerations  A/p: Katelyn Bonilla 33 y.o. H6E9888 at [redacted]w[redacted]d HD#4 admitted with superimposed preeclampsia   CHTN with superimposed preeclampsia:  BPs controlled on labetalol  200 mg PO BID.  S/p 24 hours of magnesium  sulfate (initiated for persistent headache and concern for need for delivery, but headache resolved when procardia  was stopped).  Labs have been stable.  Delivery at 34 weeks, or sooner if lab abnormalities or persistent severe feature.  Had previously been discussed with patient and husband plan for delivery at 34 weeks due to superimposed preeclampsia.  They are very concerned about preterm delivery as Carley struggled with feeding with her 77 weeker due to prematurity.  Conversation today included her husband via phone (he is at work).  They are wondering about possible delay in delivery due to blood pressure currently being well controlled.  Will request a MFM consultation for tomorrow to discuss this question (order  placed in EPIC, will call tomorrow as non-urgent).  Work note provided for FOB so that he can be present for the meeting in person Prematurity: BMZ on 12/5 and 12/6.  S/p NICU consultation.  GBS culture negative on admission GDMA1: On SSI for correction with BG elevation s/p betamethasone  LGA infant: 12/1 EFW 2513g/5lb9oz (95%), AC 99%  History of preterm delivery at 35 weeks due to PPROM (developed SI preeclampsia as well during this admission) Decreased fetal movement--placed on NST now.  Currently reassuring but not yet reactive.  Continue to monitor; BPP as indicated    [facility] GEFFEL [facility] 06/02/24 9:49 AM

## 2024-06-02 NOTE — Progress Notes (Signed)
 Preliminary read for BPP 6/8 (-2 for movements--only 2 movements in 30 minutes, not 3).  With NST, brings the score to 6/10.  Will plan for continuous monitoring today and repeat BPP in AM

## 2024-06-02 NOTE — Plan of Care (Signed)
  Problem: Education: Goal: Knowledge of disease or condition will improve Outcome: Progressing Goal: Knowledge of the prescribed therapeutic regimen will improve Outcome: Progressing Goal: Individualized Educational Video(s) Outcome: Progressing   Problem: Clinical Measurements: Goal: Complications related to the disease process, condition or treatment will be avoided or minimized Outcome: Progressing   Problem: Education: Goal: Knowledge of disease or condition will improve Outcome: Progressing Goal: Knowledge of the prescribed therapeutic regimen will improve Outcome: Progressing   Problem: Fluid Volume: Goal: Peripheral tissue perfusion will improve Outcome: Progressing   Problem: Clinical Measurements: Goal: Complications related to disease process, condition or treatment will be avoided or minimized Outcome: Progressing   Problem: Education: Goal: Knowledge of General Education information will improve Description: Including pain rating scale, medication(s)/side effects and non-pharmacologic comfort measures Outcome: Progressing   Problem: Health Behavior/Discharge Planning: Goal: Ability to manage health-related needs will improve Outcome: Progressing   Problem: Clinical Measurements: Goal: Ability to maintain clinical measurements within normal limits will improve Outcome: Progressing Goal: Will remain free from infection Outcome: Progressing Goal: Diagnostic test results will improve Outcome: Progressing Goal: Respiratory complications will improve Outcome: Progressing Goal: Cardiovascular complication will be avoided Outcome: Progressing   Problem: Activity: Goal: Risk for activity intolerance will decrease Outcome: Progressing   Problem: Nutrition: Goal: Adequate nutrition will be maintained Outcome: Progressing   Problem: Coping: Goal: Level of anxiety will decrease Outcome: Progressing   Problem: Elimination: Goal: Will not experience  complications related to bowel motility Outcome: Progressing Goal: Will not experience complications related to urinary retention Outcome: Progressing   Problem: Pain Managment: Goal: General experience of comfort will improve and/or be controlled Outcome: Progressing   Problem: Safety: Goal: Ability to remain free from injury will improve Outcome: Progressing   Problem: Skin Integrity: Goal: Risk for impaired skin integrity will decrease Outcome: Progressing

## 2024-06-02 NOTE — Progress Notes (Incomplete)
 To whom it may concern:   Katelyn Bonilla is being seen and treated in [facility] on 05/29/2024. Due to superimposed preeclampsia her husband (Moin Fernand) is needed at the bedside for continuing decision making and assistance on 06/03/2024 and 06/07/2024.    Treatment Team: Attending Provider: Delana Ted Rogena Gretta Jolene, MD  Sincerely,  Jeoffrey Seltzer, RN, BSN 06/02/2024 9:22 PM

## 2024-06-03 ENCOUNTER — Inpatient Hospital Stay (HOSPITAL_COMMUNITY)

## 2024-06-03 LAB — COMPREHENSIVE METABOLIC PANEL WITH GFR
ALT: 17 U/L (ref 0–44)
AST: 25 U/L (ref 15–41)
Albumin: 2.2 g/dL — ABNORMAL LOW (ref 3.5–5.0)
Alkaline Phosphatase: 74 U/L (ref 38–126)
Anion gap: 11 (ref 5–15)
BUN: 21 mg/dL — ABNORMAL HIGH (ref 6–20)
CO2: 22 mmol/L (ref 22–32)
Calcium: 8.4 mg/dL — ABNORMAL LOW (ref 8.9–10.3)
Chloride: 105 mmol/L (ref 98–111)
Creatinine, Ser: 0.8 mg/dL (ref 0.44–1.00)
GFR, Estimated: >60 mL/min (ref >60)
Glucose, Bld: 85 mg/dL (ref 70–99)
Potassium: 4.7 mmol/L (ref 3.5–5.1)
Sodium: 138 mmol/L (ref 135–145)
Total Bilirubin: 0.6 mg/dL (ref 0.0–1.2)
Total Protein: 5.4 g/dL — ABNORMAL LOW (ref 6.5–8.1)

## 2024-06-03 LAB — CBC
HCT: 35 % — ABNORMAL LOW (ref 36.0–46.0)
Hemoglobin: 12 g/dL (ref 12.0–15.0)
MCH: 29.6 pg (ref 26.0–34.0)
MCHC: 34.3 g/dL (ref 30.0–36.0)
MCV: 86.4 fL (ref 80.0–100.0)
Platelets: 283 K/uL (ref 150–400)
RBC: 4.05 MIL/uL (ref 3.87–5.11)
RDW: 13.9 % (ref 11.5–15.5)
WBC: 20.2 K/uL — ABNORMAL HIGH (ref 4.0–10.5)
nRBC: 0 % (ref 0.0–0.2)

## 2024-06-03 LAB — GLUCOSE, CAPILLARY
Glucose-Capillary: 77 mg/dL (ref 70–99)
Glucose-Capillary: 112 mg/dL — ABNORMAL HIGH (ref 70–99)
Glucose-Capillary: 116 mg/dL — ABNORMAL HIGH (ref 70–99)
Glucose-Capillary: 111 mg/dL — ABNORMAL HIGH (ref 70–99)

## 2024-06-03 MED ORDER — SODIUM CHLORIDE 0.9% FLUSH
3.0000 mL | Freq: Two times a day (BID) | INTRAVENOUS | Status: DC
  Administered 2024-06-03 – 2024-06-05 (×5): 3 mL via INTRAVENOUS

## 2024-06-03 NOTE — Consult Note (Signed)
 MFM Consult Note  Katelyn Bonilla is a 33 year old gravida 3 para 1 currently at 33 weeks and 4 days.  She was seen in consultation at the request of Dr. Claire due to superimposed preeclampsia.  The patient was admitted 5 days ago after noting elevated blood pressures at home along with a persistent headache.    A P/C ratio performed at the time of admission was 3.17, indicating significant proteinuria and confirming the diagnosis of superimposed preeclampsia.  She has a history of chronic hypertension that was not treated with any medications.  Due to superimposed preeclampsia, the patient was started on magnesium  for maternal seizure prophylaxis and given a complete course of antenatal corticosteroids.    She was also started on Procardia  for blood pressure control.  Procardia  was then switched to labetalol  200 mg twice a day due to her persistent headache.  Due to her persistent headache, she had a head CT which did not show any significant pathology.  Her PIH labs since admission have all been within normal limits.    Her blood pressures since admission have been in the 130s to 160s over 60s to 90s range.  The patient reports that her headache has resolved.  Her current pregnancy has also been complicated by diet-controlled gestational diabetes.  This is an IVF pregnancy.  She had a growth scan performed in our office last week that showed an EFW of 5 pounds 9 ounces (95th percentile).  She has a prior 35-week delivery due to PPROM.  Sonographic findings on today's exam Single intrauterine pregnancy at 33w 4d. Fetal cardiac activity: Observed. Presentation: Cephalic. Amniotic fluid: Within normal limits. AFI: 16.1 cm,  MVP: 6.3 cm. Placenta: Anterior. BPP: 8/8.   Her fetal heart rate tracing overnight was reassuring.  Superimposed preeclampsia  The implications and management of chronic hypertension with superimposed preeclampsia was discussed with the patient and her husband  (via telephone).    They were advised that preeclampsia can affect both the mother and the fetus.    In the mother, preeclampsia may cause a rise in blood pressures and it can affect the mother's kidney, liver and platelet functions.  It may also cause the mother to have seizures.    In the fetus, it may cause growth restriction and oligohydramnios.    They understand that delivery is the only treatment for preeclampsia.  The patient and her husband were advised that our usual recommendation for delivery in women who have preeclampsia is at around 34 weeks.  They were initially hesitant about proceeding with delivery at 34 weeks, but following our conversation today and her conversation with the neonatologist, they are now agreeable to proceed with delivery at 34 weeks.  She should remain in the hospital until delivery.    Labetalol  may be continued for blood pressure control while she is hospitalized.    She should receive magnesium  for maternal seizure prophylaxis for 24 hours postpartum.  The patient and her husband stated that all of their questions were answered today.  A total of 60 minutes was spent reviewing her chart, counseling the patient, and documenting.

## 2024-06-03 NOTE — Progress Notes (Addendum)
 Katelyn Bonilla 33 y.o. H6E9888 at [redacted]w[redacted]d HD#5  admitted with superimposed preeclampsia S: Katelyn Bonilla reports that she did not have a good night. She has back pain from the bed and from having to be monitored overnight. She reports round ligament pain. She says she got hot overnight. She denies headache, vision changes, chest pain, and shortness of breath. She reports some mild intermittent right upper quadrant pain. Otherwise reports normal fetal movement.    O: Vitals:   06/02/24 1952 06/03/24 0002 06/03/24 0429 06/03/24 0747  BP: 136/70 (!) 133/57 (!) 150/80 (!) 157/70  Pulse: 67 68 (!) 56 (!) 56  Resp: 18 16 16 17   Temp: 98 F (36.7 C) 98.2 F (36.8 C) 98.2 F (36.8 C) 97.7 F (36.5 C)  TempSrc: Oral Oral Oral Oral  SpO2: 98% 99%  96%  Weight:      Height:       Physical Exam:  General: no acute distress Pulm: normal work of breathing on room air Card: well perfused MSK: normal ROM Neuro: no focal deficits, oriented x3 Psych: normal mood, normal thought   CEFM reviewed since 4:30am: 130s moderate variability, 15 x 15 accelerations, no decelerations  A/p: Katelyn Bonilla 33 y.o. H6E9888 at [redacted]w[redacted]d HD#5 admitted with superimposed preeclampsia   CHTN with superimposed preeclampsia:  BPs moderately controlled on labetalol  200 mg PO BID, BP upper limit of mild range this morning.  S/p 24 hours of magnesium  sulfate (initiated for persistent headache and concern for need for delivery, but headache resolved when procardia  was stopped).  PIH labs have been stable/wnl, most recent this AM. We recommend delivery at 34 weeks, or sooner if Bps are uncontrolled, lab abnormalities, or persistent symptoms. This had previously been discussed with patient and husband, but they are very concerned about preterm delivery as Katelyn Bonilla struggled with feeding with her previous 25 weeker due to prematurity. They had been wondering about possible delay in delivery, but this morning Katelyn Bonilla seems more amenable to  plan for delivery at 34w. MFM consultation requested for today to discuss this question.  Prematurity: BMZ on 12/5 and 12/6.  S/p NICU consultation.  GBS culture negative on admission GDMA1: On SSI for correction with BG elevation s/p betamethasone . Required 5u yesterday. LGA fetus: 12/1 EFW 2513g/5lb9oz (95%), AC 99%  History of preterm delivery at 35 weeks due to PPROM (developed SI preeclampsia as well during this admission) Fetal wellbeing: BPP with NST 6/10 yesterday. On CEFM overnight, reassuring. Repeat BPP this morning. If reassuring, plan daily NST, or more frequently as indicated.  Dispo: Continue inpatient monitoring until delivery, likely at [redacted]w[redacted]d, unless indicated sooner.   Katelyn Bonilla A Wanza Szumski 06/03/24 8:15 AM  Addendum 14:03 Per review of Dr. Jerlene note, patient is amendable to delivery at 34 weeks for superimposed pre-eclampsia with severe features. Requested medical induction for [redacted]w[redacted]d, patient is set up for midnight induction on 12/11.

## 2024-06-04 LAB — CBC
HCT: 35.7 % — ABNORMAL LOW (ref 36.0–46.0)
Hemoglobin: 12.2 g/dL (ref 12.0–15.0)
MCH: 29.3 pg (ref 26.0–34.0)
MCHC: 34.2 g/dL (ref 30.0–36.0)
MCV: 85.8 fL (ref 80.0–100.0)
Platelets: 269 K/uL (ref 150–400)
RBC: 4.16 MIL/uL (ref 3.87–5.11)
RDW: 13.6 % (ref 11.5–15.5)
WBC: 17.2 K/uL — ABNORMAL HIGH (ref 4.0–10.5)
nRBC: 0.2 % (ref 0.0–0.2)

## 2024-06-04 LAB — COMPREHENSIVE METABOLIC PANEL WITH GFR
ALT: 21 U/L (ref 0–44)
AST: 25 U/L (ref 15–41)
Albumin: 2 g/dL — ABNORMAL LOW (ref 3.5–5.0)
Alkaline Phosphatase: 70 U/L (ref 38–126)
Anion gap: 17 — ABNORMAL HIGH (ref 5–15)
BUN: 16 mg/dL (ref 6–20)
CO2: 12 mmol/L — ABNORMAL LOW (ref 22–32)
Calcium: 8.1 mg/dL — ABNORMAL LOW (ref 8.9–10.3)
Chloride: 107 mmol/L (ref 98–111)
Creatinine, Ser: 0.71 mg/dL (ref 0.44–1.00)
GFR, Estimated: >60 mL/min (ref >60)
Glucose, Bld: 76 mg/dL (ref 70–99)
Potassium: 4.2 mmol/L (ref 3.5–5.1)
Sodium: 136 mmol/L (ref 135–145)
Total Bilirubin: 0.5 mg/dL (ref 0.0–1.2)
Total Protein: 5.1 g/dL — ABNORMAL LOW (ref 6.5–8.1)

## 2024-06-04 LAB — GLUCOSE, CAPILLARY
Glucose-Capillary: 88 mg/dL (ref 70–99)
Glucose-Capillary: 104 mg/dL — ABNORMAL HIGH (ref 70–99)
Glucose-Capillary: 98 mg/dL (ref 70–99)
Glucose-Capillary: 105 mg/dL — ABNORMAL HIGH (ref 70–99)
Glucose-Capillary: 106 mg/dL — ABNORMAL HIGH (ref 70–99)

## 2024-06-04 MED ORDER — LOPERAMIDE HCL 2 MG PO CAPS
2.0000 mg | ORAL_CAPSULE | ORAL | Status: DC | PRN
  Administered 2024-06-04: 2 mg via ORAL
  Filled 2024-06-04: qty 1

## 2024-06-04 MED ORDER — LACTATED RINGERS IV BOLUS
1000.0000 mL | Freq: Once | INTRAVENOUS | Status: AC
  Administered 2024-06-04: 1000 mL via INTRAVENOUS

## 2024-06-04 MED ORDER — SIMETHICONE 80 MG PO CHEW
80.0000 mg | CHEWABLE_TABLET | Freq: Four times a day (QID) | ORAL | Status: AC | PRN
  Administered 2024-06-04 – 2024-06-05 (×3): 80 mg via ORAL
  Filled 2024-06-04 (×3): qty 1

## 2024-06-04 NOTE — Plan of Care (Signed)
  Problem: Education: Goal: Knowledge of disease or condition will improve Outcome: Progressing Goal: Knowledge of the prescribed therapeutic regimen will improve Outcome: Progressing Goal: Individualized Educational Video(s) Outcome: Progressing   Problem: Clinical Measurements: Goal: Complications related to the disease process, condition or treatment will be avoided or minimized Outcome: Progressing   Problem: Education: Goal: Knowledge of disease or condition will improve Outcome: Progressing Goal: Knowledge of the prescribed therapeutic regimen will improve Outcome: Progressing   Problem: Fluid Volume: Goal: Peripheral tissue perfusion will improve Outcome: Progressing   Problem: Clinical Measurements: Goal: Complications related to disease process, condition or treatment will be avoided or minimized Outcome: Progressing   Problem: Education: Goal: Knowledge of General Education information will improve Description: Including pain rating scale, medication(s)/side effects and non-pharmacologic comfort measures Outcome: Progressing   Problem: Health Behavior/Discharge Planning: Goal: Ability to manage health-related needs will improve Outcome: Progressing   Problem: Clinical Measurements: Goal: Ability to maintain clinical measurements within normal limits will improve Outcome: Progressing Goal: Will remain free from infection Outcome: Progressing Goal: Diagnostic test results will improve Outcome: Progressing Goal: Respiratory complications will improve Outcome: Progressing Goal: Cardiovascular complication will be avoided Outcome: Progressing   Problem: Activity: Goal: Risk for activity intolerance will decrease Outcome: Progressing   Problem: Nutrition: Goal: Adequate nutrition will be maintained Outcome: Progressing   Problem: Coping: Goal: Level of anxiety will decrease Outcome: Progressing   Problem: Elimination: Goal: Will not experience  complications related to bowel motility Outcome: Progressing Goal: Will not experience complications related to urinary retention Outcome: Progressing   Problem: Pain Managment: Goal: General experience of comfort will improve and/or be controlled Outcome: Progressing   Problem: Safety: Goal: Ability to remain free from injury will improve Outcome: Progressing   Problem: Skin Integrity: Goal: Risk for impaired skin integrity will decrease Outcome: Progressing

## 2024-06-04 NOTE — Progress Notes (Signed)
 Brief Update Note  Contacted by bedside RN regarding Shawnell's report of pelvic/hip pain and feeling of needing to make a bowel movement. She had put Mahitha on the monitor. RN also reports higher Bps overnight, including one severe range BP.   -pelvic pain/sensation of needing to make BM: low amplitude contractions q10-31min on the monitor, per RN patient does not appear to be in labor. Flexeril  already given, plan Tylenol  and gas-x. Previously documented that this type of discomfort was relieved with passing gas. Will discontinue monitoring for now, as patient previously reported that monitoring exacerbates her back and pelvic pain. Requested RN call if pain worsens or is unresolved with these interventions in one hour.  -elevated Bps: one severe range BP, high mild range on repeat, not requiring treatment. Overall BP trend is concerning for worsening of superimposed pre-eclampsia. Will not titrate long acting blood pressure medication at this point, as it could mask severe feature of persistent severe range blood pressures. Will continue to monitor.   -FWB: fetal monitoring reactive with rare shallow variable. Will discontinue monitoring for now, repeat NST later today.

## 2024-06-04 NOTE — Progress Notes (Signed)
 CBG of 104 taken 20 min too early. NT to retake at appropriate time.

## 2024-06-04 NOTE — Progress Notes (Signed)
 Called by RM to discuss pt cramping. Of note, pt told RN she had a loose BM this AM (not fully liquid, unformed stool) as well as three yesterday. Denies lactose intolerance, has been eating margarine provided with hospital meals, normally does butter. Advised to hold off margarine. Denies temp, PreE symptoms. Noted cramping around 1000, placed on CEFM. CE closed and high, unchanged from admission. Cramping likely 2/2 looser movements, IV bolus ordered. Close eye on GI, hold on further meds at this time. Additionally, patient CBG has been WNL forom GDM standpoint, prior need for SSI coverage was 2/2 bump from BMTZ. SSI held for now BP (!) 154/71   Pulse 67   Temp 98 F (36.7 C) (Oral)   Resp 18   Ht 5' 1 (1.549 m)   Wt 114 kg   LMP 10/13/2023 Comment: negative pregnancy test on 07/11/23  SpO2 98%   BMI 47.48 kg/m

## 2024-06-04 NOTE — Progress Notes (Signed)
 Katelyn Bonilla 33 y.o. H6E9888 at [redacted]w[redacted]d HD#6  admitted with superimposed preeclampsia S: Katelyn Bonilla reports that she had ok night. She has back pain from the bed but this is stable. She reports round ligament pain. She denies headache, vision changes, chest pain, and shortness of breath. She reports rare right upper quadrant pain. Otherwise reports normal fetal movement this morning, currently about to eat breakfast.    O: Vitals:   06/04/24 0019 06/04/24 0407 06/04/24 0749 06/04/24 0804  BP: (!) 155/72 (!) 153/80 (!) 167/77 (!) 148/76  Pulse: 60 68 70 (!) 57  Resp:  18 18   Temp:  97.9 F (36.6 C) 98 F (36.7 C)   TempSrc:  Oral Oral   SpO2:  98% 98%   Weight:      Height:       Physical Exam:  General: no acute distress Pulm: normal work of breathing on room air Card: well perfused MSK: normal ROM Neuro: no focal deficits, oriented x3 Psych: normal mood, normal thought   CEFM reviewed from last night baseline 135, mod var,no decels, + accels, no TOCO  A/p: Katelyn Bonilla 33 y.o. H6E9888 at [redacted]w[redacted]d HD#6 admitted with superimposed preeclampsia   CHTN with superimposed preeclampsia:  BPs moderately controlled on labetalol  200 mg PO BID, BP upper limit of mild range this morning.  S/p 24 hours of magnesium  sulfate (initiated for persistent headache and concern for need for delivery, but headache resolved when procardia  was stopped). One SRBP at 0749 however repeat improved nd patient asymptomatic. PIH labs have been stable/wnl, most recent this AM. We recommend delivery at 34 weeks, or sooner if Bps are uncontrolled, lab abnormalities, or persistent symptoms. S/p MFM consult yesterday, patient and husband now more comfortable with ideal of preterm induction, were concerned re feeding of their first preterm child Prematurity: BMZ on 12/5 and 12/6.  S/p NICU consultation.  GBS culture negative on admission GDMA1: On SSI for correction with BG elevation s/p betamethasone . Required 3u  yesterday. LGA fetus: 12/1 EFW 2513g/5lb9oz (95%), AC 99%  History of preterm delivery at 35 weeks due to PPROM (developed SI preeclampsia as well during this admission) Fetal wellbeing: BPP 12/8 8.8, NST planned today  Dispo: Continue inpatient monitoring until delivery, likely at [redacted]w[redacted]d, unless indicated sooner. Questions answered re induction process  Katelyn Bonilla 06/04/24 9:39 AM  Scheduled for induction on L&D

## 2024-06-05 LAB — GLUCOSE, CAPILLARY
Glucose-Capillary: 104 mg/dL — ABNORMAL HIGH (ref 70–99)
Glucose-Capillary: 71 mg/dL (ref 70–99)
Glucose-Capillary: 77 mg/dL (ref 70–99)
Glucose-Capillary: 88 mg/dL (ref 70–99)
Glucose-Capillary: 93 mg/dL (ref 70–99)

## 2024-06-05 LAB — TYPE AND SCREEN
ABO/RH(D): O NEG
Antibody Screen: POSITIVE

## 2024-06-05 MED ORDER — DOCUSATE SODIUM 100 MG PO CAPS
100.0000 mg | ORAL_CAPSULE | Freq: Two times a day (BID) | ORAL | Status: DC
  Administered 2024-06-05 – 2024-06-06 (×4): 100 mg via ORAL
  Filled 2024-06-05 (×4): qty 1

## 2024-06-05 MED ORDER — LACTATED RINGERS IV BOLUS
500.0000 mL | Freq: Once | INTRAVENOUS | Status: AC
  Administered 2024-06-05: 500 mL via INTRAVENOUS

## 2024-06-05 NOTE — Progress Notes (Addendum)
 Called by RN for sudden crampoing reported by patient and intense rectal pressure. At bedside, pt appears calm, resting under covers, no increased work of breathing. Denies HA, SOB, CP, N/V. CE is unchanged from earlier (closed), hard stool palpated in vault. Will DC Immodium at this time. Irr TOCO soft and NTTP in between cramping. Low concern for PTL at this time. Additionally, had SRBP x2 (1670s/70s) at shift change however came down to 150s/70s then 140s/90 after IV labetalol  20mg  x1. Should she need recurrent IV pushes, will move to L&D for induction. Ad this time, asx from PreE standpoint. Continue labetalol  200mg  BID

## 2024-06-05 NOTE — Plan of Care (Signed)
  Problem: Education: Goal: Knowledge of disease or condition will improve Outcome: Progressing Goal: Knowledge of the prescribed therapeutic regimen will improve Outcome: Progressing Goal: Individualized Educational Video(s) Outcome: Progressing   Problem: Clinical Measurements: Goal: Complications related to the disease process, condition or treatment will be avoided or minimized Outcome: Progressing   Problem: Education: Goal: Knowledge of disease or condition will improve Outcome: Progressing Goal: Knowledge of the prescribed therapeutic regimen will improve Outcome: Progressing   Problem: Fluid Volume: Goal: Peripheral tissue perfusion will improve Outcome: Progressing   Problem: Clinical Measurements: Goal: Complications related to disease process, condition or treatment will be avoided or minimized Outcome: Progressing   Problem: Education: Goal: Knowledge of General Education information will improve Description: Including pain rating scale, medication(s)/side effects and non-pharmacologic comfort measures Outcome: Progressing   Problem: Health Behavior/Discharge Planning: Goal: Ability to manage health-related needs will improve Outcome: Progressing   Problem: Clinical Measurements: Goal: Ability to maintain clinical measurements within normal limits will improve Outcome: Progressing Goal: Will remain free from infection Outcome: Progressing Goal: Diagnostic test results will improve Outcome: Progressing Goal: Respiratory complications will improve Outcome: Progressing Goal: Cardiovascular complication will be avoided Outcome: Progressing   Problem: Activity: Goal: Risk for activity intolerance will decrease Outcome: Progressing   Problem: Nutrition: Goal: Adequate nutrition will be maintained Outcome: Progressing   Problem: Coping: Goal: Level of anxiety will decrease Outcome: Progressing   Problem: Elimination: Goal: Will not experience  complications related to bowel motility Outcome: Progressing Goal: Will not experience complications related to urinary retention Outcome: Progressing   Problem: Pain Managment: Goal: General experience of comfort will improve and/or be controlled Outcome: Progressing   Problem: Safety: Goal: Ability to remain free from injury will improve Outcome: Progressing   Problem: Skin Integrity: Goal: Risk for impaired skin integrity will decrease Outcome: Progressing

## 2024-06-05 NOTE — Progress Notes (Signed)
 Patient ID: Katelyn Bonilla, female   DOB: 02-24-1991, 33 y.o.   MRN: [id number]   Hospital day #86: 33 year old G3 P0-1-1-0 at 33+6 admitted with superimposed preeclampsia  S) overnight patient had some cramping episodes that were felt to be related to GI upset.  She did have some contractions.  Her cervix remained unchanged.  No headaches/vision changes/right upper quadrant/epigastric pain O)  Vitals:   06/05/24 0507 06/05/24 0809 06/05/24 0829 06/05/24 1213  BP: (!) 159/79 (!) 160/81 (!) 159/67 (!) 146/73  Pulse: 62 70 (!) 58 75  Resp: 20 20  19   Temp:  97.7 F (36.5 C)  98 F (36.7 C)  TempSrc:  Oral  Oral  SpO2:  98%  97%  Weight:      Height:       Alert and oriented x 3, NAD Normal work of breathing Abdomen gravid, soft  NST reactive, category 1 tracing  CBGs within normal range  Assessment/plan  1) CHTN with superimposed preeclampsia: BPs moderately controlled on labetalol  200 mg PO BID. S/p 24 hours of magnesium  sulfate (initiated for persistent headache and concern for need for delivery, but headache resolved when procardia  was stopped).  PIH labs stable normal/  - recommend delivery at 34 weeks, or sooner if Bps are uncontrolled, lab abnormalities, or persistent symptoms. S/p MFM consult yesterday, patient and husband now more comfortable with ideal of preterm induction. - Plan to initiate induction this evening.  2) prematurity: Status post betamethasone  x 2 on 12/5 and 12/6 -Status post NICU consult.  Confirmed NICU availability today -GBS negative  3) GDM A1: Blood sugar spiked after betamethasone  however have returned to normal range.  No longer requiring sliding scale insulin   4) LGA fetus: EFW on ultrasound 12/1 2513 g, 5 pounds 9 ounces, 95th percentile with AC 99th percentile.  Pelvis proven to 6 pounds 5 ounces.  Vacuum-assisted vaginal delivery due to severely elevated blood pressures and nonreassuring fetal wellbeing.  5) history of postpartum hemorrhage:  Patient had postpartum hemorrhage with first delivery with EBL of 1200 requiring Bakri balloon  6) IVF pregnancy: Wellbeing reassuring

## 2024-06-06 ENCOUNTER — Inpatient Hospital Stay (HOSPITAL_COMMUNITY): Admission: AD | Admit: 2024-06-06 | Source: Home / Self Care | Admitting: Obstetrics and Gynecology

## 2024-06-06 ENCOUNTER — Inpatient Hospital Stay (HOSPITAL_COMMUNITY): Admitting: Anesthesiology

## 2024-06-06 ENCOUNTER — Encounter (HOSPITAL_COMMUNITY): Payer: Self-pay | Admitting: Student

## 2024-06-06 ENCOUNTER — Inpatient Hospital Stay (HOSPITAL_COMMUNITY): Attending: Family Medicine

## 2024-06-06 ENCOUNTER — Encounter (HOSPITAL_COMMUNITY): Payer: Self-pay

## 2024-06-06 DIAGNOSIS — Z3490 Encounter for supervision of normal pregnancy, unspecified, unspecified trimester: Secondary | ICD-10-CM

## 2024-06-06 LAB — CBC
HCT: 36 % (ref 36.0–46.0)
HCT: 37.4 % (ref 36.0–46.0)
Hemoglobin: 12.4 g/dL (ref 12.0–15.0)
Hemoglobin: 12.6 g/dL (ref 12.0–15.0)
MCH: 29.5 pg (ref 26.0–34.0)
MCH: 28.8 pg (ref 26.0–34.0)
MCHC: 34.4 g/dL (ref 30.0–36.0)
MCHC: 33.7 g/dL (ref 30.0–36.0)
MCV: 85.7 fL (ref 80.0–100.0)
MCV: 85.4 fL (ref 80.0–100.0)
Platelets: 292 K/uL (ref 150–400)
Platelets: 287 K/uL (ref 150–400)
RBC: 4.2 MIL/uL (ref 3.87–5.11)
RBC: 4.38 MIL/uL (ref 3.87–5.11)
RDW: 13.6 % (ref 11.5–15.5)
RDW: 13.5 % (ref 11.5–15.5)
WBC: 16.5 K/uL — ABNORMAL HIGH (ref 4.0–10.5)
WBC: 16.8 K/uL — ABNORMAL HIGH (ref 4.0–10.5)
nRBC: 0 % (ref 0.0–0.2)
nRBC: 0 % (ref 0.0–0.2)

## 2024-06-06 LAB — COMPREHENSIVE METABOLIC PANEL WITH GFR
ALT: 14 U/L (ref 0–44)
AST: 17 U/L (ref 15–41)
Albumin: 2.1 g/dL — ABNORMAL LOW (ref 3.5–5.0)
Alkaline Phosphatase: 79 U/L (ref 38–126)
Anion gap: 7 (ref 5–15)
BUN: 15 mg/dL (ref 6–20)
CO2: 22 mmol/L (ref 22–32)
Calcium: 8.2 mg/dL — ABNORMAL LOW (ref 8.9–10.3)
Chloride: 107 mmol/L (ref 98–111)
Creatinine, Ser: 0.59 mg/dL (ref 0.44–1.00)
GFR, Estimated: >60 mL/min (ref >60)
Glucose, Bld: 83 mg/dL (ref 70–99)
Potassium: 4 mmol/L (ref 3.5–5.1)
Sodium: 136 mmol/L (ref 135–145)
Total Bilirubin: 0.5 mg/dL (ref 0.0–1.2)
Total Protein: 5.2 g/dL — ABNORMAL LOW (ref 6.5–8.1)

## 2024-06-06 LAB — GLUCOSE, CAPILLARY
Glucose-Capillary: 70 mg/dL (ref 70–99)
Glucose-Capillary: 70 mg/dL (ref 70–99)
Glucose-Capillary: 72 mg/dL (ref 70–99)
Glucose-Capillary: 96 mg/dL (ref 70–99)
Glucose-Capillary: 97 mg/dL (ref 70–99)

## 2024-06-06 LAB — HIV ANTIBODY (ROUTINE TESTING W REFLEX): HIV Screen 4th Generation wRfx: NONREACTIVE

## 2024-06-06 MED ORDER — PHENYLEPHRINE 80 MCG/ML (10ML) SYRINGE FOR IV PUSH (FOR BLOOD PRESSURE SUPPORT): 80.0000 ug | PREFILLED_SYRINGE | INTRAVENOUS | Status: DC | PRN

## 2024-06-06 MED ORDER — MISOPROSTOL 50MCG HALF TABLET
50.0000 ug | ORAL_TABLET | Freq: Once | ORAL | Status: AC
  Administered 2024-06-06: 50 ug via ORAL
  Filled 2024-06-06: qty 1

## 2024-06-06 MED ORDER — ACETAMINOPHEN 325 MG PO TABS: 650.0000 mg | ORAL_TABLET | ORAL | Status: DC | PRN

## 2024-06-06 MED ORDER — LACTATED RINGERS IV SOLN: 500.0000 mL | INTRAVENOUS | Status: DC | PRN

## 2024-06-06 MED ORDER — LIDOCAINE HCL (PF) 1 % IJ SOLN
INTRAMUSCULAR | Status: DC | PRN
  Administered 2024-06-06: 5 mL via EPIDURAL

## 2024-06-06 MED ORDER — ONDANSETRON HCL 4 MG/2ML IJ SOLN: 4.0000 mg | Freq: Four times a day (QID) | INTRAMUSCULAR | Status: DC

## 2024-06-06 MED ORDER — SOD CITRATE-CITRIC ACID 500-334 MG/5ML PO SOLN: 30.0000 mL | ORAL | Status: DC | PRN

## 2024-06-06 MED ORDER — MISOPROSTOL 25 MCG QUARTER TABLET
25.0000 ug | ORAL_TABLET | ORAL | Status: DC | PRN
  Administered 2024-06-06: 25 ug via VAGINAL
  Filled 2024-06-06: qty 1

## 2024-06-06 MED ORDER — OXYTOCIN BOLUS FROM INFUSION
333.0000 mL | Freq: Once | INTRAVENOUS | Status: AC
  Administered 2024-06-07: 333 mL via INTRAVENOUS

## 2024-06-06 MED ORDER — LACTATED RINGERS IV SOLN: INTRAVENOUS | Status: DC

## 2024-06-06 MED ORDER — METFORMIN HCL 500 MG PO TABS
500.0000 mg | ORAL_TABLET | Freq: Two times a day (BID) | ORAL | Status: DC
  Filled 2024-06-06 (×2): qty 1

## 2024-06-06 MED ORDER — ACETAMINOPHEN 325 MG PO TABS
650.0000 mg | ORAL_TABLET | ORAL | Status: DC | PRN
  Administered 2024-06-07 (×2): 650 mg via ORAL
  Filled 2024-06-06 (×2): qty 2

## 2024-06-06 MED ORDER — EPHEDRINE 5 MG/ML INJ: 10.0000 mg | INTRAVENOUS | Status: DC | PRN

## 2024-06-06 MED ORDER — MAGNESIUM SULFATE 40 GM/1000ML IV SOLN
1.0000 g/h | INTRAVENOUS | Status: AC
  Administered 2024-06-06: 2 g/h via INTRAVENOUS
  Administered 2024-06-08: 1 g/h via INTRAVENOUS
  Filled 2024-06-06 (×2): qty 1000

## 2024-06-06 MED ORDER — OXYTOCIN-SODIUM CHLORIDE 30-0.9 UT/500ML-% IV SOLN
1.0000 m[IU]/min | INTRAVENOUS | Status: DC
  Administered 2024-06-06 (×2): 2 m[IU]/min via INTRAVENOUS
  Filled 2024-06-06 (×2): qty 500

## 2024-06-06 MED ORDER — LIDOCAINE HCL (PF) 1 % IJ SOLN
30.0000 mL | INTRAMUSCULAR | Status: AC | PRN
  Administered 2024-06-07: 30 mL via SUBCUTANEOUS
  Filled 2024-06-06: qty 30

## 2024-06-06 MED ORDER — FENTANYL CITRATE (PF) 100 MCG/2ML IJ SOLN
50.0000 ug | INTRAMUSCULAR | Status: DC | PRN
  Administered 2024-06-06 (×3): 100 ug via INTRAVENOUS
  Filled 2024-06-06 (×4): qty 2

## 2024-06-06 MED ORDER — DIPHENHYDRAMINE HCL 50 MG/ML IJ SOLN: 12.5000 mg | INTRAMUSCULAR | Status: DC | PRN

## 2024-06-06 MED ORDER — LACTATED RINGERS IV SOLN
500.0000 mL | Freq: Once | INTRAVENOUS | Status: AC
  Administered 2024-06-06: 500 mL via INTRAVENOUS

## 2024-06-06 MED ORDER — MAGNESIUM SULFATE BOLUS VIA INFUSION
4.0000 g | Freq: Once | INTRAVENOUS | Status: AC
  Administered 2024-06-06: 4 g via INTRAVENOUS
  Filled 2024-06-06: qty 1000

## 2024-06-06 MED ORDER — LABETALOL HCL 200 MG PO TABS
200.0000 mg | ORAL_TABLET | Freq: Three times a day (TID) | ORAL | Status: DC
  Administered 2024-06-06 – 2024-06-07 (×5): 200 mg via ORAL
  Filled 2024-06-06 (×5): qty 1

## 2024-06-06 MED ORDER — OXYTOCIN BOLUS FROM INFUSION: 333.0000 mL | Freq: Once | INTRAVENOUS | Status: DC

## 2024-06-06 MED ORDER — PHENYLEPHRINE 80 MCG/ML (10ML) SYRINGE FOR IV PUSH (FOR BLOOD PRESSURE SUPPORT)
80.0000 ug | PREFILLED_SYRINGE | INTRAVENOUS | Status: DC | PRN
  Filled 2024-06-06: qty 10

## 2024-06-06 MED ORDER — ONDANSETRON HCL 4 MG/2ML IJ SOLN
4.0000 mg | Freq: Four times a day (QID) | INTRAMUSCULAR | Status: DC | PRN
  Administered 2024-06-06: 4 mg via INTRAVENOUS
  Filled 2024-06-06: qty 2

## 2024-06-06 MED ORDER — OXYTOCIN-SODIUM CHLORIDE 30-0.9 UT/500ML-% IV SOLN: 2.5000 [IU]/h | INTRAVENOUS | Status: DC

## 2024-06-06 MED ORDER — FENTANYL-BUPIVACAINE-NACL 0.5-0.125-0.9 MG/250ML-% EP SOLN
12.0000 mL/h | EPIDURAL | Status: DC | PRN
  Administered 2024-06-06: 12 mL/h via EPIDURAL
  Filled 2024-06-06: qty 250

## 2024-06-06 MED ORDER — LIDOCAINE HCL (PF) 1 % IJ SOLN: 30.0000 mL | INTRAMUSCULAR | Status: DC | PRN

## 2024-06-06 MED ORDER — TERBUTALINE SULFATE 1 MG/ML IJ SOLN: 0.2500 mg | Freq: Once | INTRAMUSCULAR | Status: DC | PRN

## 2024-06-06 MED ORDER — ONDANSETRON HCL 4 MG/2ML IJ SOLN: 4.0000 mg | Freq: Four times a day (QID) | INTRAMUSCULAR | Status: DC | PRN

## 2024-06-06 MED ORDER — OXYTOCIN-SODIUM CHLORIDE 30-0.9 UT/500ML-% IV SOLN: 1.0000 m[IU]/min | INTRAVENOUS | Status: DC

## 2024-06-06 MED ORDER — OXYTOCIN-SODIUM CHLORIDE 30-0.9 UT/500ML-% IV SOLN
2.5000 [IU]/h | INTRAVENOUS | Status: DC
  Administered 2024-06-07: 2.5 [IU]/h via INTRAVENOUS

## 2024-06-06 NOTE — Progress Notes (Signed)
 Patient ID: Katelyn Bonilla, female   DOB: [birth year], 33 y.o.   MRN: [id number] Pt complained of intense vaginal pain but cervix still 1-2 cm only dilated and very high station -3. She reported 3 doses of fentanyl  had not helped relieve pain. She tried ambulating and bouncing on ball as I suggested but pain stil intense. I agreed for pt to get an epidural at this time  After epidural pt reports significant relief of pain. Foley placed to bladder  Pt tolerating magnesium  sulfate  well - it was started due to SBP continuing to rise into severe range despite labetalol  dose increased to TID from BID. Pt did not tolerate procardia  well in past.  BP noted to improve on magnesium  sulfate.  Pt remains asymptomatic and labs wnl.  140-150/70s.  At this time, with pt comfortable with epidural, I placed a cooks foley catheter Cervix 2/50 and -3 station Vertex confirmed with bedside US   Will plan to start pitocin  as well after to max of 6cm while cooks in place.   Once foley out will AROM if vertex no longer ballotable; pitocin  per protocol if unable to AROM   Continue with expectant mgmt

## 2024-06-06 NOTE — Progress Notes (Signed)
 Patient ID: Katelyn Bonilla, female   DOB: 1990-10-13, 33 y.o.   MRN: [id number] Cervix ft thus pitocin  cancelled and cytotec  25mcg placed instead

## 2024-06-06 NOTE — Anesthesia Procedure Notes (Signed)
 Epidural Patient location during procedure: OB Start time: 06/06/2024 10:07 PM End time: 06/06/2024 10:20 PM  Staffing Anesthesiologist: Jefm Garnette LABOR, MD Performed: anesthesiologist   Preanesthetic Checklist Completed: patient identified, IV checked, site marked, risks and benefits discussed, surgical consent, monitors and equipment checked, pre-op evaluation and timeout performed  Epidural Patient position: sitting Prep: DuraPrep and site prepped and draped Patient monitoring: continuous pulse ox and blood pressure Approach: midline Location: L3-L4 Injection technique: LOR air  Needle:  Needle type: Tuohy  Needle gauge: 17 G Needle length: 9 cm and 9 Needle insertion depth: 7 cm Catheter type: closed end flexible Catheter size: 19 Gauge Catheter at skin depth: 14 cm Test dose: negative  Assessment Events: blood not aspirated, no cerebrospinal fluid, injection not painful, no injection resistance, no paresthesia and negative IV test  Additional Notes Patient identified. Risks/Benefits/Options discussed with patient including but not limited to bleeding, infection, nerve damage, paralysis, failed block, incomplete pain control, headache, blood pressure changes, nausea, vomiting, reactions to medication both or allergic, itching and postpartum back pain. Confirmed with bedside nurse the patient's most recent platelet count. Confirmed with patient that they are not currently taking any anticoagulation, have any bleeding history or any family history of bleeding disorders. Patient expressed understanding and wished to proceed. All questions were answered. Sterile technique was used throughout the entire procedure. Please see nursing notes for vital signs. Test dose was given through epidural needle and negative prior to continuing to dose epidural or start infusion. Warning signs of high block given to the patient including shortness of breath, tingling/numbness in hands, complete  motor block, or any concerning symptoms with instructions to call for help. Patient was given instructions on fall risk and not to get out of bed. All questions and concerns addressed with instructions to call with any issues.  1 Attempt (S) . Patient tolerated procedure well.

## 2024-06-06 NOTE — Progress Notes (Signed)
 Patient ID: Katelyn Bonilla, female   DOB: [birth year], 33 y.o.   MRN: [id number] Pt doing well this am. Denies HA or blurry vision. She reports contractions last night and into this am.  Shortly after I saw pt on OB specialty unit I was called by her nurse and informed she just had two severe range BPs. Pt had not received her AM dose of medication ( labetalol ) yet  BP (!) 177/81   Pulse 65   Temp 97.7 F (36.5 C) (Oral)   Resp 18   Ht 5' 1 (1.549 m)   Wt 114 kg   LMP 10/13/2023 Comment: negative pregnancy test on 07/11/23  SpO2 98%   BMI 47.48 kg/m   GEN- NAD, ambulating in room ABD - cw GA  EXT - mod edema   A/P: 69bn H6E9888 female at 39 0/[redacted]wks gestation with chtn superimposed with PreE with severe features - s/p magnesium  sulfate x 24hrs - on labetalol  - dose due now; IV protocol to be started given acute severe range - S/P BMZ - Delayed IOL - was to be started at midnight but no bed/staff avail. Has bed now - pending transfer to L/D. Induction method will be determined on exam  - S/P NICU consult - S/P MFM consult - GBS neg   2. Prematurity - s/p BMZ ; S/P NICU consult   3. GDMA1 - Glucose spike as expected following BMZ however normal range now - no meds needed   4. LGA fetus - EFW on ultrasound 12/1 2513 g, 5 pounds 9 ounces, 95th percentile with AC 99th percentile.  Pelvis proven to 6 pounds 5 ounces.  Vacuum-assisted vaginal delivery due to severely elevated blood pressures and nonreassuring fetal wellbeing.   5. Hx of pp hemorrhage following first delivery requiring bakri. Starting Hg 12.2  6. IVF pregnancy - Reactive strip  7. Exp[ectant labor mgmt

## 2024-06-06 NOTE — Anesthesia Preprocedure Evaluation (Addendum)
 Anesthesia Evaluation  Patient identified by MRN, date of birth, ID band Patient awake    Reviewed: Allergy & Precautions, NPO status , Patient's Chart, lab work & pertinent test results  Airway Mallampati: II  TM Distance: >3 FB Neck ROM: Full    Dental no notable dental hx. (+) Teeth Intact, Dental Advisory Given   Pulmonary neg pulmonary ROS   Pulmonary exam normal breath sounds clear to auscultation       Cardiovascular hypertension, Pt. on home beta blockers and Pt. on medications Normal cardiovascular exam Rhythm:Regular Rate:Normal     Neuro/Psych negative neurological ROS  negative psych ROS   GI/Hepatic negative GI ROS, Neg liver ROS,,,  Endo/Other  diabetes    Renal/GU negative Renal ROS     Musculoskeletal   Abdominal   Peds  Hematology Lab Results      Component                Value               Date                      WBC                      16.8 (H)            06/06/2024                HGB                      12.6                06/06/2024                HCT                      37.4                06/06/2024                MCV                      85.4                06/06/2024                PLT                      287                 06/06/2024              Anesthesia Other Findings   Reproductive/Obstetrics (+) Pregnancy                              Anesthesia Physical Anesthesia Plan  ASA: 3  Anesthesia Plan: Epidural   Post-op Pain Management:    Induction:   PONV Risk Score and Plan:   Airway Management Planned:   Additional Equipment:   Intra-op Plan:   Post-operative Plan:   Informed Consent: I have reviewed the patients History and Physical, chart, labs and discussed the procedure including the risks, benefits and alternatives for the proposed anesthesia with the patient or authorized representative who has indicated his/her understanding and  acceptance.       Plan Discussed with:  Anesthesia Plan Comments: (34 wk G3P1 w prE on Mg++ gDm and BMI 47.5 for LEA)        Anesthesia Quick Evaluation

## 2024-06-06 NOTE — Progress Notes (Signed)
 Katelyn Bonilla is a 33 y.o. H6E9888 at [redacted]w[redacted]d  Subjective: Pt reports discomfort with contractions. +Fms Denies HA or blurry vision. Had two elevated BPs needing iv labetalol   Objective: BP (!) 153/72   Pulse 73   Temp 98.4 F (36.9 C) (Oral)   Resp 16   Ht 5' 1 (1.549 m)   Wt 114 kg   LMP 10/13/2023 Comment: negative pregnancy test on 07/11/23  SpO2 99%   BMI 47.48 kg/m  No intake/output data recorded. No intake/output data recorded.  FHT:  FHR: 125 bpm, variability: moderate,  accelerations:  Present,  decelerations:  Absent UC:   irregular, every 3-6 minutes SVE:   Dilation: 1 Effacement (%): 60 Station: -3 Exam by:: Kelvis Berger,MD  Labs: Lab Results  Component Value Date   WBC 16.5 (H) 06/06/2024   HGB 12.4 06/06/2024   HCT 36.0 06/06/2024   MCV 85.7 06/06/2024   PLT 292 06/06/2024    Assessment / Plan: H6E9888 at 34 0/7wks with chtn and SI preE w/ severe features.   Labor: s/p vaginal cytotec  x 1. Cervix 1/60/high. Pt did not tolerate vaginal exam well - expresses discomfort. Will plan to repeat cytotec  but 50mcg orally instead. Consideration for a foley bulb when able Preeclampsia:  no signs or symptoms of toxicity, labs stable, and pt deneis HA. Plan to increase daily oral dose of labetalol  to 200mg  q 8 hrs with dose due now. Given neg preE labs will hold off magnesium  sulfate at this time ( also given pt asymptomatic) Fetal Wellbeing:  Category I Pain Control:  Labor support without medications and aware of pain relief options I/D:  n/a Anticipated MOD:  NSVD  Ted LELON Solo, DO 06/06/2024, 3:59 PM

## 2024-06-07 ENCOUNTER — Encounter (HOSPITAL_COMMUNITY): Payer: Self-pay | Admitting: Obstetrics and Gynecology

## 2024-06-07 LAB — CBC
HCT: 38.7 % (ref 36.0–46.0)
Hemoglobin: 13.1 g/dL (ref 12.0–15.0)
MCH: 29 pg (ref 26.0–34.0)
MCHC: 33.9 g/dL (ref 30.0–36.0)
MCV: 85.6 fL (ref 80.0–100.0)
Platelets: 231 K/uL (ref 150–400)
RBC: 4.52 MIL/uL (ref 3.87–5.11)
RDW: 13.7 % (ref 11.5–15.5)
WBC: 16.2 K/uL — ABNORMAL HIGH (ref 4.0–10.5)
nRBC: 0 % (ref 0.0–0.2)

## 2024-06-07 LAB — GLUCOSE, CAPILLARY
Glucose-Capillary: 62 mg/dL — ABNORMAL LOW (ref 70–99)
Glucose-Capillary: 72 mg/dL (ref 70–99)
Glucose-Capillary: 79 mg/dL (ref 70–99)

## 2024-06-07 LAB — MAGNESIUM: Magnesium: 4.6 mg/dL — ABNORMAL HIGH (ref 1.7–2.4)

## 2024-06-07 LAB — SYPHILIS: RPR W/REFLEX TO RPR TITER AND TREPONEMAL ANTIBODIES, TRADITIONAL SCREENING AND DIAGNOSIS ALGORITHM: RPR Ser Ql: NONREACTIVE

## 2024-06-07 MED ORDER — ONDANSETRON HCL 4 MG/2ML IJ SOLN: 4.0000 mg | INTRAMUSCULAR | Status: DC | PRN

## 2024-06-07 MED ORDER — FENTANYL CITRATE (PF) 100 MCG/2ML IJ SOLN
INTRAMUSCULAR | Status: DC | PRN
  Administered 2024-06-07: 100 ug via EPIDURAL

## 2024-06-07 MED ORDER — BUPIVACAINE HCL (PF) 0.25 % IJ SOLN
INTRAMUSCULAR | Status: DC | PRN
  Administered 2024-06-07: 8 mL via EPIDURAL

## 2024-06-07 MED ORDER — IBUPROFEN 600 MG PO TABS
600.0000 mg | ORAL_TABLET | Freq: Four times a day (QID) | ORAL | Status: DC
  Administered 2024-06-07 – 2024-06-09 (×9): 600 mg via ORAL
  Filled 2024-06-07 (×8): qty 1

## 2024-06-07 MED ORDER — DIBUCAINE (PERIANAL) 1 % EX OINT: 1.0000 | TOPICAL_OINTMENT | CUTANEOUS | Status: DC | PRN

## 2024-06-07 MED ORDER — BENZOCAINE-MENTHOL 20-0.5 % EX AERO: 1.0000 | INHALATION_SPRAY | CUTANEOUS | Status: DC | PRN

## 2024-06-07 MED ORDER — LACTATED RINGERS IV SOLN: INTRAVENOUS | Status: AC

## 2024-06-07 MED ORDER — TETANUS-DIPHTH-ACELL PERTUSSIS 5-2-15.5 LF-MCG/0.5 IM SUSP: 0.5000 mL | Freq: Once | INTRAMUSCULAR | Status: DC

## 2024-06-07 MED ORDER — CYCLOBENZAPRINE HCL 10 MG PO TABS
5.0000 mg | ORAL_TABLET | Freq: Four times a day (QID) | ORAL | Status: DC | PRN
  Administered 2024-06-07: 5 mg via ORAL
  Filled 2024-06-07: qty 1

## 2024-06-07 MED ORDER — SENNOSIDES-DOCUSATE SODIUM 8.6-50 MG PO TABS
2.0000 | ORAL_TABLET | Freq: Every day | ORAL | Status: DC
  Administered 2024-06-08 – 2024-06-09 (×2): 2 via ORAL
  Filled 2024-06-07 (×2): qty 2

## 2024-06-07 MED ORDER — SIMETHICONE 80 MG PO CHEW: 80.0000 mg | CHEWABLE_TABLET | ORAL | Status: DC | PRN

## 2024-06-07 MED ORDER — ONDANSETRON HCL 4 MG PO TABS: 4.0000 mg | ORAL_TABLET | ORAL | Status: DC | PRN

## 2024-06-07 MED ORDER — ZOLPIDEM TARTRATE 5 MG PO TABS: 5.0000 mg | ORAL_TABLET | Freq: Every evening | ORAL | Status: DC | PRN

## 2024-06-07 MED ORDER — ACETAMINOPHEN 325 MG PO TABS
650.0000 mg | ORAL_TABLET | ORAL | Status: DC | PRN
  Administered 2024-06-07: 650 mg via ORAL
  Filled 2024-06-07 (×2): qty 2

## 2024-06-07 MED ORDER — WITCH HAZEL-GLYCERIN EX PADS: 1.0000 | MEDICATED_PAD | CUTANEOUS | Status: DC | PRN

## 2024-06-07 MED ORDER — COCONUT OIL OIL
1.0000 | TOPICAL_OIL | Status: DC | PRN
  Administered 2024-06-09: 1 via TOPICAL

## 2024-06-07 MED ORDER — DIPHENHYDRAMINE HCL 25 MG PO CAPS: 25.0000 mg | ORAL_CAPSULE | Freq: Four times a day (QID) | ORAL | Status: DC | PRN

## 2024-06-07 MED ORDER — LABETALOL HCL 200 MG PO TABS
300.0000 mg | ORAL_TABLET | Freq: Three times a day (TID) | ORAL | Status: DC
  Administered 2024-06-08 – 2024-06-09 (×5): 300 mg via ORAL
  Filled 2024-06-07 (×5): qty 1

## 2024-06-07 MED ORDER — PRENATAL MULTIVITAMIN CH
1.0000 | ORAL_TABLET | Freq: Every day | ORAL | Status: DC
  Administered 2024-06-07 – 2024-06-09 (×3): 1 via ORAL
  Filled 2024-06-07 (×3): qty 1

## 2024-06-07 NOTE — Anesthesia Postprocedure Evaluation (Signed)
 Anesthesia Post Note  Patient: Katelyn Bonilla  Procedure(s) Performed: AN AD HOC LABOR EPIDURAL     Patient location during evaluation: Mother Baby Anesthesia Type: Epidural Level of consciousness: awake and alert Pain management: pain level controlled Vital Signs Assessment: post-procedure vital signs reviewed and stable Respiratory status: spontaneous breathing, nonlabored ventilation and respiratory function stable Cardiovascular status: stable Postop Assessment: no headache, no backache and epidural receding Anesthetic complications: no   No notable events documented.  Last Vitals:  Vitals:   06/07/24 0959 06/07/24 1405  BP: (!) 140/65 135/83  Pulse: 83 67  Resp: 18 18  Temp: 36.8 C 36.8 C  SpO2:  98%    Last Pain:  Vitals:   06/07/24 1405  TempSrc: Oral  PainSc:    Pain Goal: Patients Stated Pain Goal: 3 (06/03/24 0835)                 STEEN HOOSE

## 2024-06-07 NOTE — Lactation Note (Signed)
 This note was copied from a baby's chart.  NICU Lactation Consultation Note  Patient Name: Katelyn Bonilla Date: 06/07/2024 Age:32 hours  Reason for consult: Initial assessment; NICU baby; Late-preterm 34-36.6wks; Maternal endocrine disorder; Other (Comment) (GHTN, AMA, IVF pregnancy) Type of Endocrine Disorder?: Diabetes (GDM)  SUBJECTIVE  LC in to visit with P2 Mom of LPTI Katelyn Bonilla delivered vaginally and admitted to NICU for prematurity.  Baby is LGA > 6lbs.  Baby is on scheduled gavage feedings and breathing room air.    Mom requested to initiate pumping at 5 hrs post partum and RN set up the pump.  Mom expressed some colostrum that will be taken up to NICU for baby.  RN messaged about providing breast milk labels when Mom returns to baby.  Mom drifting asleep when LC was present.  LC washed pump parts and resized Mom with 18 mm flanges (pumped with 24 mm flanges) and washed the pump parts that were soaking in the basin.  LC provided a washing and a drying basin, labeled them.    Mom will need further education when more awake.  OBJECTIVE Infant data: Mother's Current Feeding Choice: Breast Milk and Formula  O2 Device: Room Air  Infant feeding assessment IDFS - Readiness: 4   Maternal data: [id number] Vaginal, Spontaneous Has patient been taught Hand Expression?: Yes Hand Expression Comments: colostrum present Significant Breast History:: ++ breast changes Current breast feeding challenges:: LPTI in the NICU Previous breastfeeding challenges?: Exclusive pump and bottle fed (difficult latch, infant LPT also) Does the patient have breastfeeding experience prior to this delivery?: Yes How long did the patient breastfeed?: 3 months Pumping frequency: encouraged to pump every 3 hrs, initiated pumping at 5 hrs post partum Pumped volume: 3 mL Flange Size: 18 Risk factor for low/delayed milk supply:: infant separation, AMA  WIC Program: No WIC Referral Sent?:  No Pump: Personal, Hands Free (MomCozy ordered through insurance)  ASSESSMENT Infant:  Feeding Status: Scheduled 9-12-3-6 Feeding method: Tube/Gavage (Bolus)  Maternal: Milk volume: Normal  INTERVENTIONS/PLAN Interventions: Interventions: Breast feeding basics reviewed; Skin to skin; Breast massage; Hand express; DEBP; LC Services brochure; CDC Guidelines for Breast Pump Cleaning; NICU Pumping Log Tools: Pump; Flanges Pump Education: Setup, frequency, and cleaning; Milk Storage  Plan: Consult Status: NICU follow-up NICU Follow-up type: New admission follow up; Verify DEBP issuance   Claudene Aleck BRAVO 06/07/2024, 12:21 PM

## 2024-06-07 NOTE — Progress Notes (Signed)
 Post Partum Day 0  Subjective: Patient is doing well. Notes mild HA and is dizzy. Denies VC, CO or SOB. Denies pain. Is voiding via foley catheter. Denies N/V, has not yet ordered dinner. Baby is in the NICU due to GA - she asks when the baby will be discharged. She has breast pumped twice with small amount of mild produced.   Objective: Blood pressure 128/63, pulse 72, temperature 97.8 F (36.6 C), temperature source Oral, resp. rate 17, height 5' 1 (1.549 m), weight 114 kg, last menstrual period 10/13/2023, SpO2 97%, unknown if currently breastfeeding.  Physical Exam:  General: fatigued and no distress Respiratory: no increased work of breathing  Lochia: appropriate Uterine Fundus: firm DVT Evaluation: No evidence of DVT seen on physical exam.  Recent Labs    06/06/24 2023 06/07/24 0707  HGB 12.6 13.1  HCT 37.4 38.7    Assessment/Plan: 33 yo G3P2012 now PPD0 from SVD at [redacted]w[redacted]d, w/ pregnancy complicated by for cHTN w/ SIPE.   cHTN w/ SIPE: admitted on 12/4 for BP monitoring and started on Procardia  30 mg. Ruled in for SIPE and was switched to labetalol  200 mg BID. Underwent IOL @ 34w. Plan for magnesium  sulfate x 24hr post-delivery. Labetalol  increased to 200 mg TID A2GDM: AM fasting FSBS, will need 2hr gtt at pp visit  Fetal status - in the NICU due to GA, doing well  Routine post-partum: doing well, breast pumping   Dispo: continue inpatient management. Anticipate discharge PPD3   LOS: 7 days   Charmaine CHRISTELLA Oz, MD 06/07/2024, 6:16 PM

## 2024-06-08 LAB — CBC
HCT: 28.1 % — ABNORMAL LOW (ref 36.0–46.0)
Hemoglobin: 9.5 g/dL — ABNORMAL LOW (ref 12.0–15.0)
MCH: 29 pg (ref 26.0–34.0)
MCHC: 33.8 g/dL (ref 30.0–36.0)
MCV: 85.7 fL (ref 80.0–100.0)
Platelets: 246 K/uL (ref 150–400)
RBC: 3.28 MIL/uL — ABNORMAL LOW (ref 3.87–5.11)
RDW: 14.1 % (ref 11.5–15.5)
WBC: 14.8 K/uL — ABNORMAL HIGH (ref 4.0–10.5)
nRBC: 0 % (ref 0.0–0.2)

## 2024-06-08 NOTE — Plan of Care (Signed)
  Problem: Education: Goal: Knowledge of disease or condition will improve Outcome: Progressing Goal: Knowledge of the prescribed therapeutic regimen will improve Outcome: Progressing Goal: Individualized Educational Video(s) Outcome: Progressing   Problem: Clinical Measurements: Goal: Complications related to the disease process, condition or treatment will be avoided or minimized Outcome: Progressing   Problem: Education: Goal: Knowledge of disease or condition will improve Outcome: Progressing Goal: Knowledge of the prescribed therapeutic regimen will improve Outcome: Progressing   Problem: Fluid Volume: Goal: Peripheral tissue perfusion will improve Outcome: Progressing   Problem: Clinical Measurements: Goal: Complications related to disease process, condition or treatment will be avoided or minimized Outcome: Progressing   Problem: Education: Goal: Knowledge of General Education information will improve Description: Including pain rating scale, medication(s)/side effects and non-pharmacologic comfort measures Outcome: Progressing   Problem: Health Behavior/Discharge Planning: Goal: Ability to manage health-related needs will improve Outcome: Progressing   Problem: Clinical Measurements: Goal: Ability to maintain clinical measurements within normal limits will improve Outcome: Progressing Goal: Will remain free from infection Outcome: Progressing Goal: Diagnostic test results will improve Outcome: Progressing Goal: Respiratory complications will improve Outcome: Progressing Goal: Cardiovascular complication will be avoided Outcome: Progressing   Problem: Activity: Goal: Risk for activity intolerance will decrease Outcome: Progressing   Problem: Nutrition: Goal: Adequate nutrition will be maintained Outcome: Progressing   Problem: Coping: Goal: Level of anxiety will decrease Outcome: Progressing   Problem: Elimination: Goal: Will not experience  complications related to bowel motility Outcome: Progressing Goal: Will not experience complications related to urinary retention Outcome: Progressing   Problem: Pain Managment: Goal: General experience of comfort will improve and/or be controlled Outcome: Progressing   Problem: Safety: Goal: Ability to remain free from injury will improve Outcome: Progressing   Problem: Skin Integrity: Goal: Risk for impaired skin integrity will decrease Outcome: Progressing   Problem: Education: Goal: Knowledge of Childbirth will improve Outcome: Progressing Goal: Ability to make informed decisions regarding treatment and plan of care will improve Outcome: Progressing Goal: Ability to state and carry out methods to decrease the pain will improve Outcome: Progressing Goal: Individualized Educational Video(s) Outcome: Progressing   Problem: Coping: Goal: Ability to verbalize concerns and feelings about labor and delivery will improve Outcome: Progressing   Problem: Life Cycle: Goal: Ability to make normal progression through stages of labor will improve Outcome: Progressing Goal: Ability to effectively push during vaginal delivery will improve Outcome: Progressing   Problem: Role Relationship: Goal: Will demonstrate positive interactions with the child Outcome: Progressing   Problem: Safety: Goal: Risk of complications during labor and delivery will decrease Outcome: Progressing   Problem: Pain Management: Goal: Relief or control of pain from uterine contractions will improve Outcome: Progressing   Problem: Education: Goal: Knowledge of condition will improve Outcome: Progressing Goal: Individualized Educational Video(s) Outcome: Progressing Goal: Individualized Newborn Educational Video(s) Outcome: Progressing   Problem: Activity: Goal: Will verbalize the importance of balancing activity with adequate rest periods Outcome: Progressing Goal: Ability to tolerate increased  activity will improve Outcome: Progressing   Problem: Coping: Goal: Ability to identify and utilize available resources and services will improve Outcome: Progressing   Problem: Life Cycle: Goal: Chance of risk for complications during the postpartum period will decrease Outcome: Progressing   Problem: Role Relationship: Goal: Ability to demonstrate positive interaction with newborn will improve Outcome: Progressing   Problem: Skin Integrity: Goal: Demonstration of wound healing without infection will improve Outcome: Progressing

## 2024-06-08 NOTE — Progress Notes (Signed)
 Post Partum Day 1 Subjective: Seen in NICU, doing skin to skin. Pain controlled. Lochia normal. Feeling better now that mag Dc'd.   Objective: Blood pressure (!) 150/83, pulse 72, temperature 97.9 F (36.6 C), temperature source Oral, resp. rate 18, height 5' 1 (1.549 m), weight 114 kg, last menstrual period 10/13/2023, SpO2 99%, unknown if currently breastfeeding.  Physical Exam:  General: alert, cooperative, and no distress Lochia: appropriate Uterine Fundus: deferred, skin-to-skin DVT Evaluation: 1+ edema bilaterally  Recent Labs    06/07/24 0707 06/08/24 0446  HGB 13.1 9.5*  HCT 38.7 28.1*    Assessment/Plan: 33 yo G3 now P0112 PPD#1 s/p NSVD following IOL for cHTN w/ SI preE w/ SF at 34 weeks - PP: routine care - cHTN w/ SI preE w/ SF: s/p magnesium  sulfate. Labetalol  increased to 300 TID, received 1st dose this AM. Likely needs additional titration but will observe for now - GDMA1: 6 wk GTT   LOS: 8 days   Larraine DELENA Sharps, DO 06/08/2024, 2:03 PM

## 2024-06-09 MED ORDER — FUROSEMIDE 10 MG/ML IJ SOLN
20.0000 mg | Freq: Once | INTRAMUSCULAR | Status: AC
  Administered 2024-06-09: 20 mg via INTRAVENOUS
  Filled 2024-06-09: qty 2

## 2024-06-09 MED ORDER — POLYETHYLENE GLYCOL 3350 17 GM/SCOOP PO POWD: 17.0000 g | Freq: Every day | ORAL | Status: AC | PRN

## 2024-06-09 MED ORDER — LABETALOL HCL 300 MG PO TABS: 300.0000 mg | ORAL_TABLET | Freq: Three times a day (TID) | ORAL | 1 refills | Status: AC

## 2024-06-09 MED ORDER — ACETAMINOPHEN 325 MG PO TABS: 650.0000 mg | ORAL_TABLET | Freq: Four times a day (QID) | ORAL | Status: AC | PRN

## 2024-06-09 MED ORDER — IBUPROFEN 600 MG PO TABS: 600.0000 mg | ORAL_TABLET | Freq: Four times a day (QID) | ORAL | 0 refills | Status: AC

## 2024-06-09 NOTE — Discharge Summary (Signed)
 Postpartum Discharge Summary  Date of Service updated     Patient Name: Katelyn Bonilla DOB: May 20, 1991 MRN: [id number]  Date of admission: 05/30/2024 Delivery date:06/07/2024 Delivering provider: DELANA TED MORRISON Date of discharge: 06/09/2024  Admitting diagnosis: Preeclampsia, third trimester [O14.93] Pregnancy [Z34.90] Intrauterine pregnancy: [redacted]w[redacted]d     Secondary diagnosis:  Principal Problem:   Preeclampsia, third trimester Active Problems:   Pregnancy  Additional problems: Chronic hypertension with superimposed preeclampsia with severe features, fetal macrosomia, gestational diabetes A1, IVF pregnancy    Discharge diagnosis: Preterm Pregnancy Delivered, CHTN with superimposed preeclampsia, and GDM A1                                              Post partum procedures:None Induction: Pitocin , Cytotec , and IP Foley Complications: None  Hospital course: Yalena was admitted to the hospital 05/30/2024 for antepartum admission with new diagnosis of superimposed pre-eclampsia on pre-existing chronic hypertension. On hospital day 2 she met criteria for severe features. Her antepartum stay included betamethasone  course, magnesium  sulfate for eclampsia prophylaxis, and NICU and MFM consults. She remained stable with antihypertensives until 34 weeks when she went induction of labor for cHTN w/ SI preE w/ SF. Patient had an uncomplicated labor course.  Membrane Rupture Time/Date: 3:30 AM,06/07/2024  Delivery Method:Vaginal, Spontaneous Operative Delivery:N/A Episiotomy: None Lacerations:  1st degree;Perineal Details of delivery can be found in separate delivery note.  Patient had a postpartum course complicated by titration of her anti-hypertensive regimen. Patient is discharged home 06/09/2024.  Newborn Data: Birth date:06/07/2024 Birth time:5:47 AM Gender:Female Living status:Living Apgars:9 ,9  Weight:2960 g  Magnesium  Sulfate received: Yes: Seizure prophylaxis BMZ  received: Yes Rhophylac:No MMR:N/A T-DaP:Given prenatally Flu: Given prenatally RSV Vaccine received: No Transfusion:No Immunizations administered: There is no immunization history for the selected administration types on file for this patient.  Physical exam  Vitals:   06/09/24 0340 06/09/24 0838 06/09/24 1250 06/09/24 1512  BP: 138/68 139/67 (!) 141/73 130/68  Pulse: 70 86 86 90  Resp: 16 17 17 17   Temp: 98.6 F (37 C) 98.6 F (37 C) 98.2 F (36.8 C) 98.3 F (36.8 C)  TempSrc: Oral Oral Oral Oral  SpO2: 97% 99% 99% 99%  Weight:      Height:       General: alert, cooperative, no distress, obese Lochia: appropriate Uterine Fundus: firm DVT Evaluation: 2+ edema bilaterally, no cords or calf tenderness Labs: Lab Results  Component Value Date   WBC 14.8 (H) 06/08/2024   HGB 9.5 (L) 06/08/2024   HCT 28.1 (L) 06/08/2024   MCV 85.7 06/08/2024   PLT 246 06/08/2024      Latest Ref Rng & Units 06/06/2024    1:07 PM  CMP  Glucose 70 - 99 mg/dL 83   BUN 6 - 20 mg/dL 15   Creatinine 9.55 - 1.00 mg/dL 9.40   Sodium 864 - 854 mmol/L 136   Potassium 3.5 - 5.1 mmol/L 4.0   Chloride 98 - 111 mmol/L 107   CO2 22 - 32 mmol/L 22   Calcium  8.9 - 10.3 mg/dL 8.2   Total Protein 6.5 - 8.1 g/dL 5.2   Total Bilirubin 0.0 - 1.2 mg/dL 0.5   Alkaline Phos 38 - 126 U/L 79   AST 15 - 41 U/L 17   ALT 0 - 44 U/L 14    Edinburgh Score:  06/08/2024    9:45 AM  Edinburgh Postnatal Depression Scale Screening Tool  I have been able to laugh and see the funny side of things. 0  I have looked forward with enjoyment to things. 0  I have blamed myself unnecessarily when things went wrong. 0  I have been anxious or worried for no good reason. 0  I have felt scared or panicky for no good reason. 0  Things have been getting on top of me. 0  I have been so unhappy that I have had difficulty sleeping. 0  I have felt sad or miserable. 0  I have been so unhappy that I have been crying. 0  The  thought of harming myself has occurred to me. 0  Edinburgh Postnatal Depression Scale Total 0      After visit meds:  Allergies as of 06/09/2024       Reactions   Oxycodone  Swelling   Facial edema reported 07/09/23        Medication List     STOP taking these medications    aspirin EC 81 MG tablet   PEPCID  PO       TAKE these medications    acetaminophen  325 MG tablet Commonly known as: Tylenol  Take 2 tablets (650 mg total) by mouth every 6 (six) hours as needed for moderate pain (pain score 4-6).   ibuprofen  600 MG tablet Commonly known as: ADVIL  Take 1 tablet (600 mg total) by mouth every 6 (six) hours.   labetalol  300 MG tablet Commonly known as: NORMODYNE  Take 1 tablet (300 mg total) by mouth 3 (three) times daily.   polyethylene glycol powder 17 GM/SCOOP powder Commonly known as: MiraLax  Take 17 g by mouth daily as needed for mild constipation. Dissolve 1 capful (17g) in 4-8 ounces of liquid and take by mouth daily.         Discharge home in stable condition Infant Feeding: Expressed milk Infant Disposition:NICU Discharge instruction: per After Visit Summary and Postpartum booklet. Activity: Advance as tolerated. Pelvic rest for 6 weeks.  Diet: routine diet Anticipated Birth Control: Unsure Postpartum Appointment:6 weeks Additional Postpartum F/U: Postpartum Depression checkup, 2 hour GTT, and BP check 1 week Future Appointments:No future appointments. Follow up Visit:  Follow-up Information     Ob/Gyn, Landy Stains. Schedule an appointment as soon as possible for a visit in 1 week(s).   Why: for blood pressure check Contact information: [address] [place] [address] [phone]                     06/09/2024 Larraine DELENA Sharps, DO

## 2024-06-09 NOTE — Lactation Note (Signed)
 This note was copied from a baby's chart.  NICU Lactation Consultation Note  Patient Name: Katelyn Bonilla Date: 06/09/2024 Age:32 hours  Reason for consult: Follow-up assessment; NICU baby; Maternal endocrine disorder; Other (Comment); Maternal discharge; Late-preterm 34-36.6wks (GHTN, AMA, IVF pregnancy, LGA) Type of Endocrine Disorder?: Diabetes (A1GDM)  SUBJECTIVE Visited with family of 38 36/46 weeks old AGA NICU female Katelyn Bonilla; Katelyn Bonilla is a P2 and reported she's been pumping and getting enough colostrum to collect, praised her for all her efforts. Assisted with oral care and rubbed those droplets in baby's mouth. She inquired about latching and positioning, showed her the cross cradle hold on the R side since she was already holding baby, baby did a few sucks during her sleep with her eyes closed (see LATCH score).   Noticed that pumping hasn't been consistent. Katelyn Bonilla is getting discharged from [facility] today. Reviewed discharge education, pump settings and re-educated about the importance of consistent pumping for the onset of secretory activation and the prevention of engorgement. Provided a Dr. Delores bag with baby sticker for sanitation and also recommendations for wearable pumps since she declined the [facility] pump referral and prefers to buy one out of pocket. Offered loaner pump as well since she can't stay at the hospital with baby; she has a 4 y.o at home that's having a hard time eating since mom was hospitalized 2 weeks ago.   OBJECTIVE Infant data: Mother's Current Feeding Choice: Breast Milk and Formula  O2 Device: Room Air  Infant feeding assessment IDFS - Readiness: 2   Maternal data: [id number] Vaginal, Spontaneous Pumping frequency: 3 times/24 hours Pumped volume: 2 mL  WIC Program: No WIC Referral Sent?: No Pump: Advised to call insurance company (No pump at home; she'll get reimbursed for a wearable pump she plans to buy from  [facility])  ASSESSMENT Infant: Latch: Too sleepy or reluctant, no latch achieved, no sucking elicited. Audible Swallowing: None Type of Nipple: Everted at rest and after stimulation Comfort (Breast/Nipple): Soft / non-tender Hold (Positioning): Assistance needed to correctly position infant at breast and maintain latch. LATCH Score: 5  Feeding Status: Continuous gastric feedings Feeding method: Breast  Maternal: Milk volume: Normal  INTERVENTIONS/PLAN Interventions: Interventions: Breast feeding basics reviewed; Assisted with latch; Coconut oil; DEBP; Education; Adjust position; Support pillows Discharge Education: Engorgement and breast care Tools: Coconut oil  Plan: STS with Katelyn Bonilla Pump both breasts on initiate mode every 3 hours for 15 minutes; ideally 8 pumping sessions/24 hours Switch to maintain mode once expressing +20 ml of EBM combined or by day 5 whichever happens first Bring all pump pieces to baby's room after her discharge Verify pump issuance  No other support person at this time. All questions and concerns answered, family to contact [facility] PRN.  Consult Status: NICU follow-up NICU Follow-up type: Verify DEBP issuance; Verify onset of copious milk; Verify absence of engorgement   Katelyn Bonilla 06/09/2024, 11:15 AM

## 2024-06-09 NOTE — Discharge Instructions (Signed)
 Call office with any concerns [phone]

## 2024-06-09 NOTE — Plan of Care (Signed)
  Problem: Education: Goal: Knowledge of disease or condition will improve Outcome: Completed/Met Goal: Knowledge of the prescribed therapeutic regimen will improve Outcome: Completed/Met Goal: Individualized Educational Video(s) Outcome: Completed/Met   Problem: Clinical Measurements: Goal: Complications related to the disease process, condition or treatment will be avoided or minimized Outcome: Completed/Met   Problem: Education: Goal: Knowledge of disease or condition will improve Outcome: Completed/Met Goal: Knowledge of the prescribed therapeutic regimen will improve Outcome: Completed/Met   Problem: Fluid Volume: Goal: Peripheral tissue perfusion will improve Outcome: Completed/Met   Problem: Clinical Measurements: Goal: Complications related to disease process, condition or treatment will be avoided or minimized Outcome: Completed/Met   Problem: Education: Goal: Knowledge of General Education information will improve Description: Including pain rating scale, medication(s)/side effects and non-pharmacologic comfort measures Outcome: Completed/Met   Problem: Health Behavior/Discharge Planning: Goal: Ability to manage health-related needs will improve Outcome: Completed/Met   Problem: Clinical Measurements: Goal: Ability to maintain clinical measurements within normal limits will improve Outcome: Completed/Met Goal: Will remain free from infection Outcome: Completed/Met Goal: Diagnostic test results will improve Outcome: Completed/Met Goal: Respiratory complications will improve Outcome: Completed/Met Goal: Cardiovascular complication will be avoided Outcome: Completed/Met   Problem: Activity: Goal: Risk for activity intolerance will decrease Outcome: Completed/Met   Problem: Nutrition: Goal: Adequate nutrition will be maintained Outcome: Completed/Met   Problem: Coping: Goal: Level of anxiety will decrease Outcome: Completed/Met   Problem:  Elimination: Goal: Will not experience complications related to bowel motility Outcome: Completed/Met Goal: Will not experience complications related to urinary retention Outcome: Completed/Met   Problem: Pain Managment: Goal: General experience of comfort will improve and/or be controlled Outcome: Completed/Met   Problem: Safety: Goal: Ability to remain free from injury will improve Outcome: Completed/Met   Problem: Skin Integrity: Goal: Risk for impaired skin integrity will decrease Outcome: Completed/Met   Problem: Education: Goal: Knowledge of Childbirth will improve Outcome: Completed/Met Goal: Ability to make informed decisions regarding treatment and plan of care will improve Outcome: Completed/Met Goal: Ability to state and carry out methods to decrease the pain will improve Outcome: Completed/Met Goal: Individualized Educational Video(s) Outcome: Completed/Met   Problem: Coping: Goal: Ability to verbalize concerns and feelings about labor and delivery will improve Outcome: Completed/Met   Problem: Life Cycle: Goal: Ability to make normal progression through stages of labor will improve Outcome: Completed/Met Goal: Ability to effectively push during vaginal delivery will improve Outcome: Completed/Met   Problem: Role Relationship: Goal: Will demonstrate positive interactions with the child Outcome: Completed/Met   Problem: Safety: Goal: Risk of complications during labor and delivery will decrease Outcome: Completed/Met   Problem: Pain Management: Goal: Relief or control of pain from uterine contractions will improve Outcome: Completed/Met   Problem: Education: Goal: Knowledge of condition will improve Outcome: Completed/Met Goal: Individualized Educational Video(s) Outcome: Completed/Met Goal: Individualized Newborn Educational Video(s) Outcome: Completed/Met   Problem: Activity: Goal: Will verbalize the importance of balancing activity with  adequate rest periods Outcome: Completed/Met Goal: Ability to tolerate increased activity will improve Outcome: Completed/Met   Problem: Coping: Goal: Ability to identify and utilize available resources and services will improve Outcome: Completed/Met   Problem: Life Cycle: Goal: Chance of risk for complications during the postpartum period will decrease Outcome: Completed/Met   Problem: Role Relationship: Goal: Ability to demonstrate positive interaction with newborn will improve Outcome: Completed/Met   Problem: Skin Integrity: Goal: Demonstration of wound healing without infection will improve Outcome: Completed/Met

## 2024-06-09 NOTE — Progress Notes (Signed)
 Post Partum Day 2 Subjective: LE edema. Difficulty controlling urine stream. Pain controlled. No HA, vision change, CP/SOB. Pumping but not much colostrum yesterday   Objective: Blood pressure 139/67, pulse 86, temperature 98.6 F (37 C), temperature source Oral, resp. rate 17, height 5' 1 (1.549 m), weight 114 kg, last menstrual period 10/13/2023, SpO2 99%, unknown if currently breastfeeding.  Physical Exam:  General: alert, cooperative, and obese Lochia: appropriate Uterine Fundus: firm DVT Evaluation: 2+ edema bilaterally  Recent Labs    06/07/24 0707 06/08/24 0446  HGB 13.1 9.5*  HCT 38.7 28.1*    Assessment/Plan: 33 yo G3 now P0112 PPD#2 s/p NSVD following IOL for cHTN w/ SI preE w/ SF at 34 weeks - PP: routine care. Recommended referral to PFPT as outpatient  - cHTN w/ SI preE w/ SF: s/p magnesium  sulfate. Currently on Labetalol  300 TID. BP labile yesterday but overall much improved from prior days. IV lasix  x 1 - GDMA1: 6 wk GTT - Dispo: Monitor BP q4 hours. If BP continues to be controlled by late afternoon will DC home     LOS: 9 days   Larraine DELENA Sharps, DO 06/09/2024, 9:33 AM

## 2024-06-11 LAB — SURGICAL PATHOLOGY

## 2024-06-12 ENCOUNTER — Encounter: Payer: Self-pay | Admitting: Licensed Clinical Social Worker

## 2024-06-12 NOTE — Progress Notes (Signed)
 CSW received a call from [employer] requesting patient's discharge date. CSW provided discharge date.   Nat Quiet, MSW, LCSW Clinical Social Worker  [phone] 06/12/2024  2:03 PM

## 2024-06-12 NOTE — Lactation Note (Signed)
 This note was copied from a baby's chart.  NICU Lactation Consultation Note  Patient Name: Katelyn Bonilla Date: 06/12/2024 Age:32 days  Reason for consult: Follow-up assessment; NICU baby; Maternal endocrine disorder; Other (Comment); Late-preterm 34-36.6wks; Mother's request; RN request; Breastfeeding assistance (gHTN, AMA, IVF pregnancy, LGA) Type of Endocrine Disorder?: Diabetes (A1GDM)  SUBJECTIVE Visited with family of 46 74/48 weeks old AGA NICU female Katelyn Bonilla; Katelyn Bonilla is a P2 and reported she's been pumping consistently during the day but skipping pumping sessions at night due to exhaustion. Her milk came in, praised her for all her efforts. She reported that her L nipple had a bit of blood during her 6 am pumping session this morning (see maternal assessment). Provided breast shells and reviewed prevention/treatment for sore nipples, flange # 18 still seem appropriate at this time; she's been using coconut oil prior to pumping.  Jennefer took Katelyn Bonilla to the R side in football hold and she was able to latch after a few attempts. Assisted with hand expression and finger feeding/suck training to get a sucking pattern, she suckled for about 30-60 seconds but then stopped, and when trying to re-latch she got upset and started crying until falling asleep (see LATCH score). Noticed that refrigerator seal was loose, notified NICU RN Burnard K to call facilities. Let Katelyn Bonilla know that it's still safe to feed baby pink milk if it's tinted with blood.   OBJECTIVE Infant data: Mother's Current Feeding Choice: Breast Milk and Formula  O2 Device: Room Air  Infant feeding assessment IDFS - Readiness: 2   Maternal data: [id number] Vaginal, Spontaneous Pumping frequency: 6 times/24 hours Pumped volume: 60 mL (60-120 ml)  WIC Program: No WIC Referral Sent?: No Pump: Hands Free, Personal (Mom Cozy wearable)  ASSESSMENT Infant: Latch: Repeated attempts needed to sustain latch, nipple  held in mouth throughout feeding, stimulation needed to elicit sucking reflex. Audible Swallowing: None Type of Nipple: Everted at rest and after stimulation Comfort (Breast/Nipple): Soft / non-tender Hold (Positioning): Assistance needed to correctly position infant at breast and maintain latch. LATCH Score: 6  Feeding Status: Scheduled 9-12-3-6 Feeding method: Tube/Gavage (Bolus); Breast  Maternal: Milk volume: Normal No S/S of engorgement at this time Transversal scab of about 2 mm on L nipple; already healing  INTERVENTIONS/PLAN Interventions: Interventions: Breast feeding basics reviewed; Assisted with latch; Breast massage; Hand express; Breast compression; Adjust position; Support pillows; Coconut oil; Shells; DEBP; Education Tools: Shells  Plan: STS with Katelyn Bonilla Pump both breasts on maintain mode every 3 hours for 15-30 minutes; ideally 8 pumping sessions/24 hours Wear breastshells until nipple heals, daytime only Offer the pumped breast on feeding cues around feeding times and call for assistance PRN   No other support person at this time. All questions and concerns answered, family to contact [facility] services PRN.  Consult Status: NICU follow-up NICU Follow-up type: Weekly NICU follow up; Assist with IDF-1 (Mother to pre-pump before breastfeeding)   Najla Aughenbaugh GORMAN Crate 06/12/2024, 6:55 PM

## 2024-06-17 ENCOUNTER — Telehealth (HOSPITAL_COMMUNITY): Payer: Self-pay

## 2024-06-17 NOTE — Telephone Encounter (Signed)
 06/17/2024 1006  Name: Jamiee Milholland MRN: [id number] DOB: 1990/12/14  Reason for Call:  Transition of Care Hospital Discharge Call  Contact Status: Patient Contact Status: Message  Language assistant needed:          Follow-Up Questions:    Van Postnatal Depression Scale:  In the Past 7 Days:    PHQ2-9 Depression Scale:     Discharge Follow-up:    Post-discharge interventions: NA  Signature  Rosaline Deretha PEAK

## 2024-06-21 ENCOUNTER — Ambulatory Visit

## 2024-06-28 ENCOUNTER — Ambulatory Visit

## 2024-07-24 ENCOUNTER — Other Ambulatory Visit: Payer: Self-pay

## 2024-07-24 ENCOUNTER — Ambulatory Visit: Attending: Obstetrics and Gynecology | Admitting: Physical Therapy

## 2024-07-24 ENCOUNTER — Encounter: Payer: Self-pay | Admitting: Physical Therapy

## 2024-07-24 DIAGNOSIS — R279 Unspecified lack of coordination: Secondary | ICD-10-CM | POA: Diagnosis present

## 2024-07-24 DIAGNOSIS — R293 Abnormal posture: Secondary | ICD-10-CM | POA: Insufficient documentation

## 2024-07-24 DIAGNOSIS — M6281 Muscle weakness (generalized): Secondary | ICD-10-CM | POA: Insufficient documentation

## 2024-07-24 NOTE — Therapy (Signed)
 " OUTPATIENT PHYSICAL THERAPY FEMALE PELVIC EVALUATION   Patient Name: Katelyn Bonilla MRN: [id number] DOB:01-Oct-1990, 34 y.o., female Today's Date: 07/24/2024  END OF SESSION:  PT End of Session - 07/24/24 1620     Visit Number 1    Number of Visits 8    Date for Recertification  01/21/25    Authorization Type [employer]    Authorization Time Period no auth req    PT Start Time 0200    PT Stop Time 0245    PT Time Calculation (min) 45 min    Activity Tolerance Patient tolerated treatment well    Behavior During Therapy [facility] for tasks assessed/performed          Past Medical History:  Diagnosis Date   Acute cholecystitis due to biliary calculus 07/10/2023   Chronic hypertension with superimposed preeclampsia 05/30/2020   Gestational diabetes mellitus (GDM), antepartum 04/15/2020   Hypertension    Obesity    BMI >40   Pregnancy induced hypertension    Preterm premature rupture of membranes (PPROM) with unknown onset of labor 06/11/2020   Past Surgical History:  Procedure Laterality Date   CHOLECYSTECTOMY N/A 07/11/2023   Procedure: LAPAROSCOPIC CHOLECYSTECTOMY WITH ICG DYE;  Surgeon: Ebbie Cough, MD;  Location: [facility];  Service: General;  Laterality: N/A;   Patient Active Problem List   Diagnosis Date Noted   Pregnancy 06/06/2024   Preeclampsia, third trimester 05/30/2024   Gestational diabetes, diet controlled 05/27/2024   Pregnancy resulting from in vitro fertilization in second trimester 03/15/2024   Not immune to rubella 12/26/2023   RhD negative 12/26/2023   History of gestational diabetes mellitus (GDM) 12/21/2023   History of preterm premature rupture of membranes (PPROM) 12/21/2023   History of pre-eclampsia 12/21/2023   Maternal chronic hypertension 12/21/2023   Obesity 12/21/2023   Previous baby delivered by vacuum extraction 12/21/2023   Mixed hyperlipidemia 08/04/2019   Vitamin D deficiency 08/04/2019   PCP: wrong per chart   REFERRING  PROVIDER: Diedre Rosaline BRAVO, MD  REFERRING DIAG: N39.3 (ICD-10-CM) - Stress incontinence (female) (female)  THERAPY DIAG:  Muscle weakness (generalized)  Unspecified lack of coordination  Abnormal posture  Rationale for Evaluation and Treatment: Rehabilitation  ONSET DATE: 2021  SUBJECTIVE:                                                                                                                                                                                           SUBJECTIVE STATEMENT: EYE-sha  Eval: After her second delivery 06/07/24, she noticed worsening urinary control. This leakage has gotten slightly better, urgency is better. Even with exercise these days, she  is not leaking. The only time leakage happens is with sneezing and intercourse, sometimes also when transferring.  Fluid intake: 1 water  bottle per day (20 oz); glass of orange juice; caffienated tea with milk (1 per day)   FUNCTIONAL LIMITATIONS: none  PERTINENT HISTORY:  Medications for current condition: none for her bladder control  Surgeries: gallbladder removal last year 07/11/23 Other: Delivery at 35 weeks in 2025, PCOS diagnosis, mild endo  Sexual abuse: No  PAIN:  Are you having pain? Yes NPRS scale: 5/10 Pain location: low back and lower belly   Pain type: pulling  Pain description: intermittent   Aggravating factors: sitting too long  Relieving factors: res/offload   PRECAUTIONS: None  RED FLAGS: None   WEIGHT BEARING RESTRICTIONS: No  FALLS:  Has patient fallen in last 6 months? 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   ACTIVITY LEVEL : started exercising, walking (25 minutes daily) and jogging, picking up 10 lbs weight - does not leak with jogging   PLOF: Independent  PATIENT GOALS: to not leak with sneezing and during intercourse   BOWEL MOVEMENT: Pain with bowel movement: No Type of bowel movement:Type (Bristol Stool Scale) 4, Frequency 1x/day, Strain no, and  Splinting no Fully empty rectum: Yes:   Leakage: No                                                  Caused by: n/a Bowel urgency: n/a Pads: No Fiber supplement/laxative No  URINATION: Pain with urination: No Fully empty bladder: Yes:                                           Post-void dribble: No Stream: Strong Urgency: Yes  Frequency:during the day within normal limits                                                         Nocturia: No   Leakage: Sneezing, Intercourse, and transferring onto the bed  Pads/briefs: No  INTERCOURSE:  Ability to have vaginal penetration Yes  Pain with intercourse: none Dryness: Yes  Climax: yes  Marinoff Scale: 1/3 Lubricant: yes  PREGNANCY: Vaginal deliveries 2 Tearing Yes: yes with first and second  Episiotomy No C-section deliveries 0 Currently pregnant No  PROLAPSE: None  OBJECTIVE:  Note: Objective measures were completed at Evaluation unless otherwise noted.  PATIENT SURVEYS:  PFIQ-7: 35  COGNITION: Overall cognitive status: Within functional limits for tasks assessed     SENSATION: Light touch: Appears intact  LUMBAR SPECIAL TESTS:  Straight leg raise test: Positive  FUNCTIONAL TESTS:  Single leg stance:  Rt: +   Lt: +  Sit-up test: 1/3 Squat: within normal limits  Bed mobility: within normal limits   GAIT: Assistive device utilized: None Comments: mild trendelenburg gait pattern with ambulation   POSTURE: rounded shoulders and forward head  LUMBARAROM/PROM: within normal limits for all motions tested bilaterally with no pain   LOWER EXTREMITY ROM: within normal limits for all motions tested bilaterally with no pain   LOWER EXTREMITY MMT: 3/5  bilateral knees and hips grossly with no pain   PALPATION:  General: no tenderness to palpation of bilateral hip flexors   Pelvic Alignment: within normal limits   Abdominal: abdominal bracing at rest  Diastasis: No Distortion: No  Breathing: apical breathing  pattern  Scar tissue: No Active Straight Leg Raise: + bilateral                 External Perineal Exam: normal moisture levels with sufficient clitoral hood mobility                              Internal Pelvic Floor: Patient demonstrates low tone and strength in the superficial and deep pelvic floor musculature, with a 2/5 manual muscle test score vaginally. She had no palpable trigger points or pain during today's exam. Lack of coordination present between the diaphragm and pelvic floor.   Patient confirms identification and approves PT to assess internal pelvic floor and treatment Yes No emotional/communication barriers or cognitive limitation. Patient is motivated to learn. Patient understands and agrees with treatment goals and plan. PT explains patient will be examined in standing, sitting, and lying down to see how their muscles and joints work. When they are ready, they will be asked to remove their underwear so PT can examine their perineum. The patient is also given the option of providing their own chaperone as one is not provided in our facility. The patient also has the right and is explained the right to defer or refuse any part of the evaluation or treatment including the internal exam. With the patient's consent, PT will use one gloved finger to gently assess the muscles of the pelvic floor, seeing how well it contracts and relaxes and if there is muscle symmetry. After, the patient will get dressed and PT and patient will discuss exam findings and plan of care. PT and patient discuss plan of care, schedule, attendance policy and HEP activities. All internal or external pelvic floor assessments and/or treatments are completed with proper hand hygiene and gloves hands. If needed gloves are changed with hand hygiene during patient care time.  PELVIC MMT:   MMT eval  Vaginal 2/5, 5 quick flicks, 0 sec hold   Internal Anal Sphincter   External Anal Sphincter   Puborectalis   (Blank  rows = not tested)  TONE: Low in bilateral aspects of superficial and deep pelvic floor musculature   PROLAPSE: None present with cough test in hooklying   TODAY'S TREATMENT:                                                                                                                              DATE:   EVAL 07/24/24: Examination completed, findings reviewed, pt educated on POC, HEP, and self care. Pt motivated to participate in PT and agreeable to attempt recommendations.    Exercises - Supine Pelvic Floor Contraction  - 1  x daily - 7 x weekly - 3 sets - 10 reps - Quick Flick Pelvic Floor Contractions in Hooklying  - 1 x daily - 7 x weekly - 3 sets - 10 reps  Patient Education - Get To Know Your Pelvic Floor- Female - Urinary Incontinence - Lifestyle Changes that Support Urinary Health  PATIENT EDUCATION:  Education details: Get To Know Your Pelvic Floor- Female - Urinary Incontinence - Lifestyle Changes that Support Urinary Health Person educated: Patient Education method: Explanation, Demonstration, Tactile cues, Verbal cues, and Handouts Education comprehension: verbalized understanding, returned demonstration, verbal cues required, tactile cues required, and needs further education  HOME EXERCISE PROGRAM: Access Code: [email or web address] URL: [email or web address] Date: 07/24/2024 Prepared by: Celena Domino  Exercises - Supine Pelvic Floor Contraction  - 1 x daily - 7 x weekly - 3 sets - 10 reps - Quick Flick Pelvic Floor Contractions in Hooklying  - 1 x daily - 7 x weekly - 3 sets - 10 reps  Patient Education - Get To Know Your Pelvic Floor- Female - Urinary Incontinence - Lifestyle Changes that Support Urinary Health  ASSESSMENT:  CLINICAL IMPRESSION: Patient is a 34 y.o. female  who was seen today for physical therapy evaluation and treatment for urinary incontinence after the birth of her second child 06/07/24. She will leak urine with sneezing and during  intercourse, and occasionally when transferring. Pt also experienced urinary incontinence after the birth of her first child, and the incontinence she sis currently experiencing has been slowly improving since giving birth. She is not having any bowel related concerns, has no pain with intercourse, and does not experience vaginal heaviness. Pelvic floor examination revealed low tone and strength in the superficial and deep pelvic floor musculature, with a 2/5 manual muscle test score vaginally. She had no palpable trigger points or pain during today's exam. Lack of coordination present between the diaphragm and pelvic floor. Pt educated on pelvic floor AROM exercises and endurance training. Overall, pt tolerated session well and Pt would benefit from additional PT to further address deficits.    OBJECTIVE IMPAIRMENTS: decreased coordination, decreased endurance, decreased mobility, decreased ROM, decreased strength, impaired flexibility, and impaired sensation.   ACTIVITY LIMITATIONS: continence and toileting  PARTICIPATION LIMITATIONS: interpersonal relationship, community activity, and occupation  PERSONAL FACTORS: Age, Past/current experiences, and Time since onset of injury/illness/exacerbation are also affecting patient's functional outcome.   REHAB POTENTIAL: Good  CLINICAL DECISION MAKING: Stable/uncomplicated  EVALUATION COMPLEXITY: Low   GOALS: Goals reviewed with patient? Yes  SHORT TERM GOALS: Target date: 08/21/2024  Pt will be independent with HEP.  Baseline: Goal status: INITIAL  2.  Pt will be independent with use of squatty potty, relaxed toileting mechanics, and improved bowel movement techniques in order to increase ease of bowel movements and complete evacuation.  Baseline:  Goal status: INITIAL  3.  Pt will be independent with the knack, urge suppression technique, and double voiding in order to improve bladder habits and decrease urinary incontinence.  Baseline:   Goal status: INITIAL  LONG TERM GOALS: Target date: 01/21/2025  Pt will be independent with advanced HEP.  Baseline:  Goal status: INITIAL  2.  Pt to demonstrate improved coordination of pelvic floor and breathing mechanics with 10# squat with appropriate synergistic patterns to decrease pain and leakage at least 75% of the time for improved ability to complete a 30 minute workout with strain at pelvic floor and symptoms.   Baseline:  Goal status: INITIAL  3.  Pt will report no  leaks with laughing, coughing, sneezing in order to improve comfort with interpersonal relationships and community activities.  Baseline:  Goal status: INITIAL  4.  Pt will demonstrate normal pelvic floor muscle tone and A/ROM, able to achieve 3/5 strength with contractions and 10 sec endurance, in order to reduce urinary leaking and number of pads patient wears.  Baseline:  Goal status: INITIAL  PLAN:  PT FREQUENCY: 1-2x/week  PT DURATION: 6 months  PLANNED INTERVENTIONS: 97110-Therapeutic exercises, 97530- Therapeutic activity, 97112- Neuromuscular re-education, 97535- Self Care, [id number]- Manual therapy, Patient/Family education, Taping, Joint mobilization, Spinal mobilization, Scar mobilization, Cryotherapy, Moist heat, and Biofeedback  PLAN FOR NEXT SESSION: continued pelvic floor training in seated; introduce urge and knack drill; discuss toileting mechanics; manual to back; core and hip strengthening   Celena JAYSON Domino, PT 07/24/2024, 4:21 PM  "
